# Patient Record
Sex: Male | Born: 1955 | Race: White | Hispanic: No | Smoking: Former smoker
Health system: Southern US, Community
[De-identification: ages and names within clinical notes are randomized; demographics above are authoritative.]

## PROBLEM LIST (undated history)

## (undated) DIAGNOSIS — I700 Atherosclerosis of aorta: Secondary | ICD-10-CM

## (undated) DIAGNOSIS — K219 Gastro-esophageal reflux disease without esophagitis: Secondary | ICD-10-CM

## (undated) DIAGNOSIS — E785 Hyperlipidemia, unspecified: Secondary | ICD-10-CM

## (undated) DIAGNOSIS — I10 Essential (primary) hypertension: Secondary | ICD-10-CM

## (undated) DIAGNOSIS — Z9981 Dependence on supplemental oxygen: Secondary | ICD-10-CM

## (undated) DIAGNOSIS — I5020 Unspecified systolic (congestive) heart failure: Secondary | ICD-10-CM

## (undated) DIAGNOSIS — I509 Heart failure, unspecified: Secondary | ICD-10-CM

## (undated) DIAGNOSIS — M1990 Unspecified osteoarthritis, unspecified site: Secondary | ICD-10-CM

## (undated) DIAGNOSIS — N401 Enlarged prostate with lower urinary tract symptoms: Secondary | ICD-10-CM

## (undated) DIAGNOSIS — J189 Pneumonia, unspecified organism: Secondary | ICD-10-CM

## (undated) DIAGNOSIS — R001 Bradycardia, unspecified: Secondary | ICD-10-CM

## (undated) DIAGNOSIS — R918 Other nonspecific abnormal finding of lung field: Secondary | ICD-10-CM

## (undated) DIAGNOSIS — N200 Calculus of kidney: Secondary | ICD-10-CM

## (undated) DIAGNOSIS — J449 Chronic obstructive pulmonary disease, unspecified: Secondary | ICD-10-CM

## (undated) DIAGNOSIS — M539 Dorsopathy, unspecified: Secondary | ICD-10-CM

## (undated) DIAGNOSIS — J439 Emphysema, unspecified: Secondary | ICD-10-CM

## (undated) DIAGNOSIS — N138 Other obstructive and reflux uropathy: Secondary | ICD-10-CM

## (undated) DIAGNOSIS — U071 COVID-19: Secondary | ICD-10-CM

## (undated) DIAGNOSIS — R0600 Dyspnea, unspecified: Secondary | ICD-10-CM

## (undated) HISTORY — PX: BACK SURGERY: SHX140

---

## 1989-04-03 HISTORY — PX: FOOT SURGERY: SHX648

## 2005-04-13 ENCOUNTER — Ambulatory Visit: Payer: Self-pay

## 2005-06-05 ENCOUNTER — Ambulatory Visit: Payer: Self-pay | Admitting: General Practice

## 2005-06-05 HISTORY — PX: KNEE ARTHROSCOPY W/ MENISCECTOMY: SHX1879

## 2006-05-01 ENCOUNTER — Ambulatory Visit: Payer: Self-pay | Admitting: Internal Medicine

## 2007-12-17 ENCOUNTER — Other Ambulatory Visit: Payer: Self-pay

## 2007-12-18 ENCOUNTER — Observation Stay: Payer: Self-pay | Admitting: Internal Medicine

## 2007-12-26 ENCOUNTER — Ambulatory Visit: Payer: Self-pay | Admitting: Internal Medicine

## 2009-06-01 ENCOUNTER — Ambulatory Visit: Payer: Self-pay | Admitting: Otolaryngology

## 2012-03-26 ENCOUNTER — Ambulatory Visit: Payer: Self-pay | Admitting: Orthopedic Surgery

## 2012-04-03 HISTORY — PX: ENDOSCOPIC RELEASE TRANSVERSE CARPAL LIGAMENT OF HAND: SUR444

## 2012-05-04 HISTORY — PX: OTHER SURGICAL HISTORY: SHX169

## 2013-11-04 DIAGNOSIS — J449 Chronic obstructive pulmonary disease, unspecified: Secondary | ICD-10-CM | POA: Insufficient documentation

## 2014-08-17 ENCOUNTER — Other Ambulatory Visit: Payer: Self-pay | Admitting: Infectious Diseases

## 2014-08-17 DIAGNOSIS — K219 Gastro-esophageal reflux disease without esophagitis: Secondary | ICD-10-CM | POA: Insufficient documentation

## 2014-08-17 DIAGNOSIS — N5089 Other specified disorders of the male genital organs: Secondary | ICD-10-CM

## 2014-08-21 ENCOUNTER — Ambulatory Visit: Payer: 59

## 2015-12-01 ENCOUNTER — Other Ambulatory Visit: Payer: Self-pay | Admitting: Specialist

## 2015-12-01 DIAGNOSIS — R918 Other nonspecific abnormal finding of lung field: Secondary | ICD-10-CM

## 2015-12-07 DIAGNOSIS — R0609 Other forms of dyspnea: Secondary | ICD-10-CM

## 2015-12-13 ENCOUNTER — Inpatient Hospital Stay
Admission: RE | Admit: 2015-12-13 | Discharge: 2015-12-13 | Disposition: A | Discharge status: Home / Self Care | Payer: Self-pay | Source: Ambulatory Visit | Attending: Specialist | Admitting: Specialist

## 2015-12-13 ENCOUNTER — Ambulatory Visit
Admission: RE | Admit: 2015-12-13 | Discharge: 2015-12-13 | Disposition: A | Discharge status: Home / Self Care | Payer: Managed Care, Other (non HMO) | Source: Ambulatory Visit | Attending: Specialist | Admitting: Specialist

## 2015-12-13 ENCOUNTER — Other Ambulatory Visit: Payer: Self-pay | Admitting: Specialist

## 2015-12-13 DIAGNOSIS — R918 Other nonspecific abnormal finding of lung field: Secondary | ICD-10-CM

## 2015-12-13 DIAGNOSIS — J432 Centrilobular emphysema: Secondary | ICD-10-CM | POA: Diagnosis not present

## 2015-12-13 DIAGNOSIS — I700 Atherosclerosis of aorta: Secondary | ICD-10-CM | POA: Insufficient documentation

## 2015-12-17 ENCOUNTER — Other Ambulatory Visit: Payer: Self-pay | Admitting: Specialist

## 2015-12-17 DIAGNOSIS — R918 Other nonspecific abnormal finding of lung field: Secondary | ICD-10-CM

## 2016-01-31 DIAGNOSIS — I493 Ventricular premature depolarization: Secondary | ICD-10-CM | POA: Insufficient documentation

## 2016-01-31 DIAGNOSIS — E782 Mixed hyperlipidemia: Secondary | ICD-10-CM | POA: Insufficient documentation

## 2016-01-31 DIAGNOSIS — I10 Essential (primary) hypertension: Secondary | ICD-10-CM | POA: Insufficient documentation

## 2016-03-21 DIAGNOSIS — J9611 Chronic respiratory failure with hypoxia: Secondary | ICD-10-CM | POA: Insufficient documentation

## 2016-03-21 DIAGNOSIS — J432 Centrilobular emphysema: Secondary | ICD-10-CM | POA: Insufficient documentation

## 2016-04-10 ENCOUNTER — Ambulatory Visit: Payer: Managed Care, Other (non HMO)

## 2016-04-24 ENCOUNTER — Ambulatory Visit
Admission: RE | Admit: 2016-04-24 | Discharge: 2016-04-24 | Disposition: A | Discharge status: Home / Self Care | Payer: Managed Care, Other (non HMO) | Source: Ambulatory Visit | Attending: Specialist | Admitting: Specialist

## 2016-04-24 DIAGNOSIS — R918 Other nonspecific abnormal finding of lung field: Secondary | ICD-10-CM | POA: Diagnosis present

## 2016-04-26 ENCOUNTER — Other Ambulatory Visit: Payer: Self-pay | Admitting: Specialist

## 2016-04-26 DIAGNOSIS — R911 Solitary pulmonary nodule: Secondary | ICD-10-CM

## 2016-05-08 DIAGNOSIS — M5432 Sciatica, left side: Secondary | ICD-10-CM | POA: Insufficient documentation

## 2016-07-20 DIAGNOSIS — R001 Bradycardia, unspecified: Secondary | ICD-10-CM | POA: Insufficient documentation

## 2016-07-20 DIAGNOSIS — I5022 Chronic systolic (congestive) heart failure: Secondary | ICD-10-CM | POA: Insufficient documentation

## 2016-07-25 ENCOUNTER — Ambulatory Visit
Admission: RE | Admit: 2016-07-25 | Discharge: 2016-07-25 | Disposition: A | Discharge status: Home / Self Care | Payer: Managed Care, Other (non HMO) | Source: Ambulatory Visit | Attending: Specialist | Admitting: Specialist

## 2016-07-25 DIAGNOSIS — R918 Other nonspecific abnormal finding of lung field: Secondary | ICD-10-CM | POA: Diagnosis not present

## 2016-07-25 DIAGNOSIS — J439 Emphysema, unspecified: Secondary | ICD-10-CM | POA: Insufficient documentation

## 2016-07-25 DIAGNOSIS — R911 Solitary pulmonary nodule: Secondary | ICD-10-CM

## 2016-07-31 DIAGNOSIS — Q762 Congenital spondylolisthesis: Secondary | ICD-10-CM | POA: Insufficient documentation

## 2016-07-31 DIAGNOSIS — M5136 Other intervertebral disc degeneration, lumbar region: Secondary | ICD-10-CM | POA: Insufficient documentation

## 2016-08-04 HISTORY — PX: OTHER SURGICAL HISTORY: SHX169

## 2016-12-12 ENCOUNTER — Encounter: Payer: Managed Care, Other (non HMO) | Attending: Specialist

## 2016-12-12 VITALS — Ht 67.9 in | Wt 157.7 lb

## 2016-12-12 DIAGNOSIS — J449 Chronic obstructive pulmonary disease, unspecified: Secondary | ICD-10-CM | POA: Diagnosis not present

## 2016-12-12 DIAGNOSIS — Z87891 Personal history of nicotine dependence: Secondary | ICD-10-CM | POA: Diagnosis not present

## 2016-12-12 NOTE — Patient Instructions (Signed)
Patient Instructions  Patient Details  Name: Bryan Montoya MRN: [id number] Date of Birth: November 18, 1955 Referring Provider:  Mertie Moores, MD  Below are the personal goals you chose as well as exercise and nutrition goals. Our goal is to help you keep on track towards obtaining and maintaining your goals. We will be discussing your progress on these goals with you throughout the program.  Initial Exercise Prescription:     Initial Exercise Prescription - 12/12/16 1600      Date of Initial Exercise RX and Referring Provider   Date 12/12/16   Referring Provider Brett Canales MD     Oxygen   Oxygen Continuous   Liters 2     Treadmill   MPH 2.6   Grade 0.5   Minutes 15   METs 3.17     NuStep   Level 3   SPM 80   Minutes 15   METs 3     REL-XR   Level 2   Speed 50   Minutes 15   METs 3     Prescription Details   Frequency (times per week) 3   Duration Progress to 45 minutes of aerobic exercise without signs/symptoms of physical distress     Intensity   THRR 40-80% of Max Heartrate 93-136   Ratings of Perceived Exertion 11-13   Perceived Dyspnea 0-4     Progression   Progression Continue to progress workloads to maintain intensity without signs/symptoms of physical distress.     Resistance Training   Training Prescription Yes   Weight 3 lbs   Reps 10-15      Exercise Goals: Frequency: Be able to perform aerobic exercise three times per week working toward 3-5 days per week.  Intensity: Work with a perceived exertion of 11 (fairly light) - 15 (hard) as tolerated. Follow your new exercise prescription and watch for changes in prescription as you progress with the program. Changes will be reviewed with you when they are made.  Duration: You should be able to do 30 minutes of continuous aerobic exercise in addition to a 5 minute warm-up and a 5 minute cool-down routine.  Nutrition Goals: Your personal nutrition goals will be established when you do your  nutrition analysis with the dietician.  The following are nutrition guidelines to follow: Cholesterol < /day Sodium < /day Fiber: Men over 50 yrs - 30 grams per day  Personal Goals:     Personal Goals and Risk Factors at Admission - 12/12/16 1414      Core Components/Risk Factors/Patient Goals on Admission    Weight Management Weight Maintenance;Yes   Intervention Weight Management: Develop a combined nutrition and exercise program designed to reach desired caloric intake, while maintaining appropriate intake of nutrient and fiber, sodium and fats, and appropriate energy expenditure required for the weight goal.;Weight Management: Provide education and appropriate resources to help participant work on and attain dietary goals.   Admit Weight 157 lb 11.2 oz (71.5 kg)   Goal Weight: Short Term 157 lb (71.2 kg)   Goal Weight: Long Term 155 lb (70.3 kg)   Expected Outcomes Short Term: Continue to assess and modify interventions until short term weight is achieved;Long Term: Adherence to nutrition and physical activity/exercise program aimed toward attainment of established weight goal;Weight Maintenance: Understanding of the daily nutrition guidelines, which includes 25-35% calories from fat, 7% or less cal from saturated fats, less than  cholesterol, less than 1.5gm of sodium, & 5 or more servings of fruits and  vegetables daily   Improve shortness of breath with ADL's Yes   Intervention Provide education, individualized exercise plan and daily activity instruction to help decrease symptoms of SOB with activities of daily living.   Expected Outcomes Short Term: Achieves a reduction of symptoms when performing activities of daily living.   Stress Yes   Intervention Offer individual and/or small group education and counseling on adjustment to heart disease, stress management and health-related lifestyle change. Teach and support self-help strategies.;Refer participants experiencing  significant psychosocial distress to appropriate mental health specialists for further evaluation and treatment. When possible, include family members and significant others in education/counseling sessions.   Expected Outcomes Short Term: Participant demonstrates changes in health-related behavior, relaxation and other stress management skills, ability to obtain effective social support, and compliance with psychotropic medications if prescribed.;Long Term: Emotional wellbeing is indicated by absence of clinically significant psychosocial distress or social isolation.      Tobacco Use Initial Evaluation: History  Smoking Status  . Former Smoker  . Packs/day: 1.00  . Years: 30.00  . Types: Cigarettes  . Quit date: 01/01/2006  Smokeless Tobacco  . Never Used    Comment: patient does not smoke    Exercise Goals and Review:     Exercise Goals    Row Name 12/12/16 1613             Exercise Goals   Increase Physical Activity Yes       Intervention Provide advice, education, support and counseling about physical activity/exercise needs.;Develop an individualized exercise prescription for aerobic and resistive training based on initial evaluation findings, risk stratification, comorbidities and participant's personal goals.       Expected Outcomes Achievement of increased cardiorespiratory fitness and enhanced flexibility, muscular endurance and strength shown through measurements of functional capacity and personal statement of participant.       Increase Strength and Stamina Yes       Intervention Provide advice, education, support and counseling about physical activity/exercise needs.;Develop an individualized exercise prescription for aerobic and resistive training based on initial evaluation findings, risk stratification, comorbidities and participant's personal goals.       Expected Outcomes Achievement of increased cardiorespiratory fitness and enhanced flexibility, muscular  endurance and strength shown through measurements of functional capacity and personal statement of participant.       Able to understand and use rate of perceived exertion (RPE) scale Yes       Intervention Provide education and explanation on how to use RPE scale       Expected Outcomes Short Term: Able to use RPE daily in rehab to express subjective intensity level;Long Term:  Able to use RPE to guide intensity level when exercising independently       Able to understand and use Dyspnea scale Yes       Intervention Provide education and explanation on how to use Dyspnea scale       Expected Outcomes Short Term: Able to use Dyspnea scale daily in rehab to express subjective sense of shortness of breath during exertion;Long Term: Able to use Dyspnea scale to guide intensity level when exercising independently       Knowledge and understanding of Target Heart Rate Range (THRR) Yes       Intervention Provide education and explanation of THRR including how the numbers were predicted and where they are located for reference       Expected Outcomes Short Term: Able to state/look up THRR;Long Term: Able to use THRR to govern  intensity when exercising independently;Short Term: Able to use daily as guideline for intensity in rehab       Able to check pulse independently Yes       Intervention Provide education and demonstration on how to check pulse in carotid and radial arteries.;Review the importance of being able to check your own pulse for safety during independent exercise       Expected Outcomes Short Term: Able to explain why pulse checking is important during independent exercise;Long Term: Able to check pulse independently and accurately       Understanding of Exercise Prescription Yes       Intervention Provide education, explanation, and written materials on patient's individual exercise prescription       Expected Outcomes Short Term: Able to explain program exercise prescription;Long Term: Able  to explain home exercise prescription to exercise independently          Copy of goals given to participant.

## 2016-12-12 NOTE — Progress Notes (Signed)
Pulmonary Individual Treatment Plan  Patient Details  Name: Bryan Montoya MRN: [id number] Date of Birth: May 19, 1955 Referring Provider:     Pulmonary Rehab from 12/12/2016 in [facility] Cardiac and Pulmonary Rehab  Referring Provider  Brett Canales MD      Initial Encounter Date:    Pulmonary Rehab from 12/12/2016 in [facility] Cardiac and Pulmonary Rehab  Date  12/12/16  Referring Provider  Brett Canales MD      Visit Diagnosis: Chronic obstructive pulmonary disease, unspecified COPD type (HCC)  Patient's Home Medications on Admission: No current outpatient prescriptions on file.  Past Medical History: History reviewed. No pertinent past medical history.  Tobacco Use: History  Smoking Status  . Former Smoker  . Packs/day: 1.00  . Years: 30.00  . Types: Cigarettes  . Quit date: 01/01/2006  Smokeless Tobacco  . Never Used    Comment: patient does not smoke    Labs: Recent Review Flowsheet Data    There is no flowsheet data to display.       Pulmonary Assessment Scores:     Pulmonary Assessment Scores    Row Name 12/12/16 1459         ADL UCSD   ADL Phase Entry     SOB Score total 44     Rest 0     Walk 1     Stairs 5     Bath 2     Dress 1     Shop 1       CAT Score   CAT Score 22       mMRC Score   mMRC Score 2        Pulmonary Function Assessment:     Pulmonary Function Assessment - 12/12/16 1513      Initial Spirometry Results   FVC% 71 %   FEV1% 49 %   FEV1/FVC Ratio 52.34     Post Bronchodilator Spirometry Results   FVC% 66.03 %   FEV1% 47.04 %   FEV1/FVC Ratio 54     Breath   Bilateral Breath Sounds Clear;Decreased   Shortness of Breath Limiting activity;Yes      Exercise Target Goals: Date: 12/12/16  Exercise Program Goal: Individual exercise prescription set with THRR, safety & activity barriers. Participant demonstrates ability to understand and report RPE using BORG scale, to self-measure pulse accurately, and to  acknowledge the importance of the exercise prescription.  Exercise Prescription Goal: Starting with aerobic activity 30 plus minutes a day, 3 days per week for initial exercise prescription. Provide home exercise prescription and guidelines that participant acknowledges understanding prior to discharge.  Activity Barriers & Risk Stratification:     Activity Barriers & Cardiac Risk Stratification - 12/12/16 1612      Activity Barriers & Cardiac Risk Stratification   Activity Barriers Back Problems;Neck/Spine Problems;Shortness of Breath;Muscular Weakness;Deconditioning  back surgery 4 months ago (fusion L4-S1)      6 Minute Walk:     6 Minute Walk    Row Name 12/12/16 1610         6 Minute Walk   Phase Initial     Distance 1420 feet  wearing back brace     Walk Time 6 minutes     # of Rest Breaks 0     MPH 2.69     METS 3.34     RPE 13     Perceived Dyspnea  2     VO2 Peak 11.7     Symptoms No  Resting HR 51 bpm     Resting BP 128/64     Resting Oxygen Saturation  98 %     Exercise Oxygen Saturation  during 6 min walk 84 %     Max Ex. HR 61 bpm     Max Ex. BP 134/74     2 Minute Post BP 122/70       Interval HR   1 Minute HR 58     2 Minute HR 61     4 Minute HR 58     5 Minute HR 58     6 Minute HR 61     2 Minute Post HR 53     Interval Heart Rate? Yes       Interval Oxygen   Interval Oxygen? Yes     Baseline Oxygen Saturation % 98 %     1 Minute Oxygen Saturation % 96 %     1 Minute Liters of Oxygen 2 L  pulsed     2 Minute Oxygen Saturation % 94 %     2 Minute Liters of Oxygen 2 L     3 Minute Oxygen Saturation % 89 %     3 Minute Liters of Oxygen 2 L     4 Minute Oxygen Saturation % 91 %     4 Minute Liters of Oxygen 2 L     5 Minute Oxygen Saturation % 92 %     5 Minute Liters of Oxygen 2 L     6 Minute Oxygen Saturation % 84 %     6 Minute Liters of Oxygen 2 L     2 Minute Post Oxygen Saturation % 100 %     2 Minute Post Liters of Oxygen  2 L       Oxygen Initial Assessment:     Oxygen Initial Assessment - 12/12/16 1520      Home Oxygen   Home Oxygen Device Portable Concentrator;Home Concentrator;E-Tanks   Sleep Oxygen Prescription Continuous   Liters per minute 2   Home Exercise Oxygen Prescription Continuous   Liters per minute 2   Home at Rest Exercise Oxygen Prescription None   Compliance with Home Oxygen Use Yes     Initial 6 min Walk   Oxygen Used Pulsed   Liters per minute 2     Program Oxygen Prescription   Program Oxygen Prescription Continuous   Liters per minute 2     Intervention   Short Term Goals To learn and exhibit compliance with exercise, home and travel O2 prescription;To learn and understand importance of maintaining oxygen saturations>88%;To learn and demonstrate proper use of respiratory medications;To learn and demonstrate proper pursed lip breathing techniques or other breathing techniques.;To learn and understand importance of monitoring SPO2 with pulse oximeter and demonstrate accurate use of the pulse oximeter.   Long  Term Goals Exhibits compliance with exercise, home and travel O2 prescription;Maintenance of O2 saturations>88%;Compliance with respiratory medication;Demonstrates proper use of MDI's;Exhibits proper breathing techniques, such as pursed lip breathing or other method taught during program session;Verbalizes importance of monitoring SPO2 with pulse oximeter and return demonstration      Oxygen Re-Evaluation:   Oxygen Discharge (Final Oxygen Re-Evaluation):   Initial Exercise Prescription:     Initial Exercise Prescription - 12/12/16 1600      Date of Initial Exercise RX and Referring Provider   Date 12/12/16   Referring Provider Brett Canales MD     Oxygen   Oxygen Continuous  Liters 2     Treadmill   MPH 2.6   Grade 0.5   Minutes 15   METs 3.17     NuStep   Level 3   SPM 80   Minutes 15   METs 3     REL-XR   Level 2   Speed 50   Minutes 15    METs 3     Prescription Details   Frequency (times per week) 3   Duration Progress to 45 minutes of aerobic exercise without signs/symptoms of physical distress     Intensity   THRR 40-80% of Max Heartrate 93-136   Ratings of Perceived Exertion 11-13   Perceived Dyspnea 0-4     Progression   Progression Continue to progress workloads to maintain intensity without signs/symptoms of physical distress.     Resistance Training   Training Prescription Yes   Weight 3 lbs   Reps 10-15      Perform Capillary Blood Glucose checks as needed.  Exercise Prescription Changes:     Exercise Prescription Changes    Row Name 12/12/16 1600             Response to Exercise   Blood Pressure (Admit) 128/64       Blood Pressure (Exercise) 134/74       Blood Pressure (Exit) 122/70       Heart Rate (Admit) 51 bpm       Heart Rate (Exercise) 61 bpm       Heart Rate (Exit) 53 bpm       Oxygen Saturation (Admit) 98 %       Oxygen Saturation (Exercise) 84 %       Oxygen Saturation (Exit) 100 %       Rating of Perceived Exertion (Exercise) 13       Perceived Dyspnea (Exercise) 2       Symptoms none       Comments walk test results, wearing back brace          Exercise Comments:   Exercise Goals and Review:     Exercise Goals    Row Name 12/12/16 1613             Exercise Goals   Increase Physical Activity Yes       Intervention Provide advice, education, support and counseling about physical activity/exercise needs.;Develop an individualized exercise prescription for aerobic and resistive training based on initial evaluation findings, risk stratification, comorbidities and participant's personal goals.       Expected Outcomes Achievement of increased cardiorespiratory fitness and enhanced flexibility, muscular endurance and strength shown through measurements of functional capacity and personal statement of participant.       Increase Strength and Stamina Yes        Intervention Provide advice, education, support and counseling about physical activity/exercise needs.;Develop an individualized exercise prescription for aerobic and resistive training based on initial evaluation findings, risk stratification, comorbidities and participant's personal goals.       Expected Outcomes Achievement of increased cardiorespiratory fitness and enhanced flexibility, muscular endurance and strength shown through measurements of functional capacity and personal statement of participant.       Able to understand and use rate of perceived exertion (RPE) scale Yes       Intervention Provide education and explanation on how to use RPE scale       Expected Outcomes Short Term: Able to use RPE daily in rehab to express subjective intensity level;Long Term:  Able to  use RPE to guide intensity level when exercising independently       Able to understand and use Dyspnea scale Yes       Intervention Provide education and explanation on how to use Dyspnea scale       Expected Outcomes Short Term: Able to use Dyspnea scale daily in rehab to express subjective sense of shortness of breath during exertion;Long Term: Able to use Dyspnea scale to guide intensity level when exercising independently       Knowledge and understanding of Target Heart Rate Range (THRR) Yes       Intervention Provide education and explanation of THRR including how the numbers were predicted and where they are located for reference       Expected Outcomes Short Term: Able to state/look up THRR;Long Term: Able to use THRR to govern intensity when exercising independently;Short Term: Able to use daily as guideline for intensity in rehab       Able to check pulse independently Yes       Intervention Provide education and demonstration on how to check pulse in carotid and radial arteries.;Review the importance of being able to check your own pulse for safety during independent exercise       Expected Outcomes Short Term:  Able to explain why pulse checking is important during independent exercise;Long Term: Able to check pulse independently and accurately       Understanding of Exercise Prescription Yes       Intervention Provide education, explanation, and written materials on patient's individual exercise prescription       Expected Outcomes Short Term: Able to explain program exercise prescription;Long Term: Able to explain home exercise prescription to exercise independently          Exercise Goals Re-Evaluation :   Discharge Exercise Prescription (Final Exercise Prescription Changes):     Exercise Prescription Changes - 12/12/16 1600      Response to Exercise   Blood Pressure (Admit) 128/64   Blood Pressure (Exercise) 134/74   Blood Pressure (Exit) 122/70   Heart Rate (Admit) 51 bpm   Heart Rate (Exercise) 61 bpm   Heart Rate (Exit) 53 bpm   Oxygen Saturation (Admit) 98 %   Oxygen Saturation (Exercise) 84 %   Oxygen Saturation (Exit) 100 %   Rating of Perceived Exertion (Exercise) 13   Perceived Dyspnea (Exercise) 2   Symptoms none   Comments walk test results, wearing back brace      Nutrition:  Target Goals: Understanding of nutrition guidelines, daily intake of sodium 1500mg , cholesterol 200mg , calories 30% from fat and 7% or less from saturated fats, daily to have 5 or more servings of fruits and vegetables.  Biometrics:     Pre Biometrics - 12/12/16 1616      Pre Biometrics   Height 5' 7.9" (1.725 m)   Weight 157 lb 11.2 oz (71.5 kg)   Waist Circumference 38 inches   Hip Circumference 38.25 inches   Waist to Hip Ratio 0.99 %   BMI (Calculated) 24.04       Nutrition Therapy Plan and Nutrition Goals:     Nutrition Therapy & Goals - 12/12/16 1519      Nutrition Therapy   RD appointment defered Yes     Intervention Plan   Intervention Nutrition handout(s) given to patient.;Prescribe, educate and counsel regarding individualized specific dietary modifications aiming  towards targeted core components such as weight, hypertension, lipid management, diabetes, heart failure and other comorbidities.   Expected  Outcomes Short Term Goal: Understand basic principles of dietary content, such as calories, fat, sodium, cholesterol and nutrients.;Long Term Goal: Adherence to prescribed nutrition plan.      Nutrition Discharge: Rate Your Plate Scores:     Nutrition Assessments - 12/12/16 1546      MEDFICTS Scores   Pre Score 70      Nutrition Goals Re-Evaluation:   Nutrition Goals Discharge (Final Nutrition Goals Re-Evaluation):   Psychosocial: Target Goals: Acknowledge presence or absence of significant depression and/or stress, maximize coping skills, provide positive support system. Participant is able to verbalize types and ability to use techniques and skills needed for reducing stress and depression.   Initial Review & Psychosocial Screening:     Initial Psych Review & Screening - 12/12/16 1505      Initial Review   Current issues with Current Depression;Current Sleep Concerns;Current Stress Concerns   Source of Stress Concerns Chronic Illness;Unable to perform yard/household activities     Family Dynamics   Good Support System? Yes     Barriers   Psychosocial barriers to participate in program The patient should benefit from training in stress management and relaxation.     Screening Interventions   Interventions Yes;Encouraged to exercise;To provide support and resources with identified psychosocial needs   Expected Outcomes Short Term goal: Utilizing psychosocial counselor, staff and physician to assist with identification of specific Stressors or current issues interfering with healing process. Setting desired goal for each stressor or current issue identified.;Long Term Goal: Stressors or current issues are controlled or eliminated.;Short Term goal: Identification and review with participant of any Quality of Life or Depression concerns  found by scoring the questionnaire.;Long Term goal: The participant improves quality of Life and PHQ9 Scores as seen by post scores and/or verbalization of changes      Quality of Life Scores:   PHQ-9: Recent Review Flowsheet Data    Depression screen [facility] 2/9 12/12/2016   Decreased Interest 2   Down, Depressed, Hopeless 2   PHQ - 2 Score 4   Altered sleeping 2   Tired, decreased energy 2   Change in appetite 0   Feeling bad or failure about yourself  2   Trouble concentrating 3   Moving slowly or fidgety/restless 3   Suicidal thoughts 0   PHQ-9 Score 16   Difficult doing work/chores Somewhat difficult     Interpretation of Total Score  Total Score Depression Severity:  1-4 = Minimal depression, 5-9 = Mild depression, 10-14 = Moderate depression, 15-19 = Moderately severe depression, 20-27 = Severe depression   Psychosocial Evaluation and Intervention:   Psychosocial Re-Evaluation:   Psychosocial Discharge (Final Psychosocial Re-Evaluation):   Education: Education Goals: Education classes will be provided on a weekly basis, covering required topics. Participant will state understanding/return demonstration of topics presented.  Learning Barriers/Preferences:     Learning Barriers/Preferences - 12/12/16 1518      Learning Barriers/Preferences   Learning Barriers Sight   Learning Preferences Skilled Demonstration      Education Topics: Initial Evaluation Education: - Verbal, written and demonstration of respiratory meds, RPE/PD scales, oximetry and breathing techniques. Instruction on use of nebulizers and MDIs: cleaning and proper use, rinsing mouth with steroid doses and importance of monitoring MDI activations.   Pulmonary Rehab from 12/12/2016 in [facility] Cardiac and Pulmonary Rehab  Date  12/12/16  Educator  [facility]  Instruction Review Code  1- Verbalizes Understanding      General Nutrition Guidelines/Fats and Fiber: -Group instruction provided by  verbal, written  material, models and posters to present the general guidelines for heart healthy nutrition. Gives an explanation and review of dietary fats and fiber.   Controlling Sodium/Reading Food Labels: -Group verbal and written material supporting the discussion of sodium use in heart healthy nutrition. Review and explanation with models, verbal and written materials for utilization of the food label.   Exercise Physiology & Risk Factors: - Group verbal and written instruction with models to review the exercise physiology of the cardiovascular system and associated critical values. Details cardiovascular disease risk factors and the goals associated with each risk factor.   Aerobic Exercise & Resistance Training: - Gives group verbal and written discussion on the health impact of inactivity. On the components of aerobic and resistive training programs and the benefits of this training and how to safely progress through these programs.   Flexibility, Balance, General Exercise Guidelines: - Provides group verbal and written instruction on the benefits of flexibility and balance training programs. Provides general exercise guidelines with specific guidelines to those with heart or lung disease. Demonstration and skill practice provided.   Stress Management: - Provides group verbal and written instruction about the health risks of elevated stress, cause of high stress, and healthy ways to reduce stress.   Depression: - Provides group verbal and written instruction on the correlation between heart/lung disease and depressed mood, treatment options, and the stigmas associated with seeking treatment.   Exercise & Equipment Safety: - Individual verbal instruction and demonstration of equipment use and safety with use of the equipment.   Pulmonary Rehab from 12/12/2016 in [facility] Cardiac and Pulmonary Rehab  Date  12/12/16  Educator  [facility]  Instruction Review Code  1- Verbalizes Understanding       Infection Prevention: - Provides verbal and written material to individual with discussion of infection control including proper hand washing and proper equipment cleaning during exercise session.   Pulmonary Rehab from 12/12/2016 in [facility] Cardiac and Pulmonary Rehab  Date  12/12/16  Educator  [facility]  Instruction Review Code  1- Verbalizes Understanding      Falls Prevention: - Provides verbal and written material to individual with discussion of falls prevention and safety.   Pulmonary Rehab from 12/12/2016 in [facility] Cardiac and Pulmonary Rehab  Date  12/12/16  Educator  [facility]  Instruction Review Code  1- Verbalizes Understanding      Diabetes: - Individual verbal and written instruction to review signs/symptoms of diabetes, desired ranges of glucose level fasting, after meals and with exercise. Advice that pre and post exercise glucose checks will be done for 3 sessions at entry of program.   Chronic Lung Diseases: - Group verbal and written instruction to review new updates, new respiratory medications, new advancements in procedures and treatments. Provide informative websites and "800" numbers of self-education.   Lung Procedures: - Group verbal and written instruction to describe testing methods done to diagnose lung disease. Review the outcome of test results. Describe the treatment choices: Pulmonary Function Tests, ABGs and oximetry.   Energy Conservation: - Provide group verbal and written instruction for methods to conserve energy, plan and organize activities. Instruct on pacing techniques, use of adaptive equipment and posture/positioning to relieve shortness of breath.   Triggers: - Group verbal and written instruction to review types of environmental controls: home humidity, furnaces, filters, dust mite/pet prevention, HEPA vacuums. To discuss weather changes, air quality and the benefits of nasal washing.   Exacerbations: - Group verbal and written instruction to  provide:  warning signs, infection symptoms, calling MD promptly, preventive modes, and value of vaccinations. Review: effective airway clearance, coughing and/or vibration techniques. Create an Sport and exercise psychologistAction Plan.   Oxygen: - Individual and group verbal and written instruction on oxygen therapy. Includes supplement oxygen, available portable oxygen systems, continuous and intermittent flow rates, oxygen safety, concentrators, and Medicare reimbursement for oxygen.   [facility]  Date  12/12/16  [facility]  Instruction Review Code  1- Verbalizes Understanding      Respiratory Medications: - Group verbal and written instruction to review medications for lung disease. Drug class, frequency, complications, importance of spacers, rinsing mouth after steroid MDI's, and proper cleaning methods for nebulizers.   [facility]  Date  12/12/16  [facility]  Instruction Review Code  1- Verbalizes Understanding      AED/CPR: - Group verbal and written instruction with the use of models to demonstrate the basic use of the AED with the basic ABC's of resuscitation.   Breathing Retraining: - Provides individuals verbal and written instruction on purpose, frequency, and proper technique of diaphragmatic breathing and pursed-lipped breathing. Applies individual practice skills.   [facility]  Date  12/12/16  [facility]  Instruction Review Code  1- Verbalizes Understanding      Anatomy and Physiology of the Lungs: - Group verbal and written instruction with the use of models to provide basic lung anatomy and physiology related to function, structure and complications of lung disease.   Anatomy & Physiology of the Heart: - Group verbal and written instruction and models provide basic cardiac anatomy and physiology, with the coronary  electrical and arterial systems. Review of: AMI, Angina, Valve disease, Heart Failure, Cardiac Arrhythmia, Pacemakers, and the ICD.   Heart Failure: - Group verbal and written instruction on the basics of heart failure: signs/symptoms, treatments, explanation of ejection fraction, enlarged heart and cardiomyopathy.   Sleep Apnea: - Individual verbal and written instruction to review Obstructive Sleep Apnea. Review of risk factors, methods for diagnosing and types of masks and machines for OSA.   Anxiety: - Provides group, verbal and written instruction on the correlation between heart/lung disease and anxiety, treatment options, and management of anxiety.   Relaxation: - Provides group, verbal and written instruction about the benefits of relaxation for patients with heart/lung disease. Also provides patients with examples of relaxation techniques.   Cardiac Medications: - Group verbal and written instruction to review commonly prescribed medications for heart disease. Reviews the medication, class of the drug, and side effects.   Know Your Numbers: -Group verbal and written instruction about important numbers in your health.  Review of Cholesterol, Blood Pressure, Diabetes, and BMI and the role they play in your overall health.   Other: -Provides group and verbal instruction on various topics (see comments)    Knowledge Questionnaire Score:     Knowledge Questionnaire Score - 12/12/16 1453      Knowledge Questionnaire Score   Pre Score 8/10  Reviewed with patient       Core Components/Risk Factors/Patient Goals at Admission:     Personal Goals and Risk Factors at Admission - 12/12/16 1414      Core Components/Risk Factors/Patient Goals on Admission    Weight Management Weight Maintenance;Yes   Intervention Weight Management: Develop a combined nutrition and exercise program designed to reach desired caloric intake, while maintaining  appropriate intake of nutrient  and fiber, sodium and fats, and appropriate energy expenditure required for the weight goal.;Weight Management: Provide education and appropriate resources to help participant work on and attain dietary goals.   Admit Weight 157 lb 11.2 oz (71.5 kg)   Goal Weight: Short Term 157 lb (71.2 kg)   Goal Weight: Long Term 155 lb (70.3 kg)   Expected Outcomes Short Term: Continue to assess and modify interventions until short term weight is achieved;Long Term: Adherence to nutrition and physical activity/exercise program aimed toward attainment of established weight goal;Weight Maintenance: Understanding of the daily nutrition guidelines, which includes 25-35% calories from fat, 7% or less cal from saturated fats, less than  cholesterol, less than 1.5gm of sodium, & 5 or more servings of fruits and vegetables daily   Improve shortness of breath with ADL's Yes   Intervention Provide education, individualized exercise plan and daily activity instruction to help decrease symptoms of SOB with activities of daily living.   Expected Outcomes Short Term: Achieves a reduction of symptoms when performing activities of daily living.   Stress Yes   Intervention Offer individual and/or small group education and counseling on adjustment to heart disease, stress management and health-related lifestyle change. Teach and support self-help strategies.;Refer participants experiencing significant psychosocial distress to appropriate mental health specialists for further evaluation and treatment. When possible, include family members and significant others in education/counseling sessions.   Expected Outcomes Short Term: Participant demonstrates changes in health-related behavior, relaxation and other stress management skills, ability to obtain effective social support, and compliance with psychotropic medications if prescribed.;Long Term: Emotional wellbeing is indicated by absence of clinically significant psychosocial  distress or social isolation.      Core Components/Risk Factors/Patient Goals Review:    Core Components/Risk Factors/Patient Goals at Discharge (Final Review):    ITP Comments:     ITP Comments    Row Name 12/12/16 1411           ITP Comments Medical Evaluation completed. Chart sent for review and changes to Dr. Bethann Punches director of [facility].          Comments: Initial ITP

## 2016-12-20 DIAGNOSIS — J449 Chronic obstructive pulmonary disease, unspecified: Secondary | ICD-10-CM | POA: Diagnosis not present

## 2016-12-20 NOTE — Progress Notes (Signed)
Daily Session Note  Patient Details  Name: CAIDAN HUBBERT MRN: [id number] Date of Birth: [birth year] Referring Provider:     Pulmonary Rehab from 12/12/2016 in [facility] Cardiac and Pulmonary Rehab  Referring Provider  Ancil Linsey MD      Encounter Date: 12/20/2016  Check In:     Session Check In - 12/20/16 1031      Check-In   Location [facility]-Cardiac & Pulmonary Rehab   Staff Present Alberteen Sam, MA, ACSM RCEP, Exercise Physiologist;Amanda Oletta Darter, BA, ACSM CEP, Exercise Physiologist;Joseph Flavia Shipper   Supervising physician immediately available to respond to emergencies [facility] immediately available ER MD   Physician(s) Williams/[place]   Medication changes reported     No   Fall or balance concerns reported    No   Warm-up and Cool-down Performed as group-led instruction   Resistance Training Performed Yes   VAD Patient? No     Pain Assessment   Currently in Pain? No/denies         History  Smoking Status  . Former Smoker  . Packs/day: 1.00  . Years: 30.00  . Types: Cigarettes  . Quit date: 01/01/2006  Smokeless Tobacco  . Never Used    Comment: patient does not smoke    Goals Met:  Proper associated with RPD/PD & O2 Sat Independence with exercise equipment Exercise tolerated well Strength training completed today  Goals Unmet:  Not Applicable  Comments: First full day of exercise!  Patient was oriented to gym and equipment including functions, settings, policies, and procedures.  Patient's individual exercise prescription and treatment plan were reviewed.  All starting workloads were established based on the results of the 6 minute walk test done at initial orientation visit.  The plan for exercise progression was also introduced and progression will be customized based on patient's performance and goals.    Dr. Emily Filbert is Medical Director for [facility] and [facility].

## 2016-12-22 DIAGNOSIS — J449 Chronic obstructive pulmonary disease, unspecified: Secondary | ICD-10-CM

## 2016-12-22 NOTE — Progress Notes (Signed)
Daily Session Note  Patient Details  Name: Bryan Montoya MRN: [id number] Date of Birth: 1955/11/01 Referring Provider:     Pulmonary Rehab from 12/12/2016 in [facility] Cardiac and Pulmonary Rehab  Referring Provider  Ancil Linsey MD      Encounter Date: 12/22/2016  Check In:     Session Check In - 12/22/16 1011      Check-In   Location [facility]-Cardiac & Pulmonary Rehab   Staff Present Nada Maclachlan, BA, ACSM CEP, Exercise Physiologist;Carroll Enterkin, RN, BSN;Sani Madariaga [employer] physician immediately available to respond to emergencies [facility] immediately available ER MD   Physician(s) Dr. Archie Balboa and Joni Fears   Medication changes reported     No   Fall or balance concerns reported    No   Warm-up and Cool-down Performed as group-led instruction   Resistance Training Performed Yes   VAD Patient? No     Pain Assessment   Currently in Pain? No/denies   Multiple Pain Sites No         History  Smoking Status  . Former Smoker  . Packs/day: 1.00  . Years: 30.00  . Types: Cigarettes  . Quit date: 01/01/2006  Smokeless Tobacco  . Never Used    Comment: patient does not smoke    Goals Met:  Exercise tolerated well No report of cardiac concerns or symptoms Strength training completed today  Goals Unmet:  Not Applicable  Comments: Pt able to follow exercise prescription today without complaint.  Will continue to monitor for progression.   Dr. Emily Filbert is Medical Director for [facility] and [facility].

## 2016-12-25 DIAGNOSIS — J449 Chronic obstructive pulmonary disease, unspecified: Secondary | ICD-10-CM

## 2016-12-25 NOTE — Progress Notes (Signed)
Pulmonary Individual Treatment Plan  Patient Details  Name: Bryan Montoya MRN: [id number] Date of Birth: [birth year] Referring Provider:     Pulmonary Rehab from 12/12/2016 in [facility] Cardiac and Pulmonary Rehab  Referring Provider  Brett Canales MD      Initial Encounter Date:    Pulmonary Rehab from 12/12/2016 in [facility] Cardiac and Pulmonary Rehab  Date  12/12/16  Referring Provider  Brett Canales MD      Visit Diagnosis: Chronic obstructive pulmonary disease, unspecified COPD type (HCC)  Patient's Home Medications on Admission: No current outpatient prescriptions on file.  Past Medical History: No past medical history on file.  Tobacco Use: History  Smoking Status  . Former Smoker  . Packs/day: 1.00  . Years: 30.00  . Types: Cigarettes  . Quit date: 01/01/2006  Smokeless Tobacco  . Never Used    Comment: patient does not smoke    Labs: Recent Review Flowsheet Data    There is no flowsheet data to display.       Pulmonary Assessment Scores:     Pulmonary Assessment Scores    Row Name 12/12/16 1459         ADL UCSD   ADL Phase Entry     SOB Score total 44     Rest 0     Walk 1     Stairs 5     Bath 2     Dress 1     Shop 1       CAT Score   CAT Score 22       mMRC Score   mMRC Score 2        Pulmonary Function Assessment:     Pulmonary Function Assessment - 12/12/16 1513      Initial Spirometry Results   FVC% 71 %   FEV1% 49 %   FEV1/FVC Ratio 52.34     Post Bronchodilator Spirometry Results   FVC% 66.03 %   FEV1% 47.04 %   FEV1/FVC Ratio 54     Breath   Bilateral Breath Sounds Clear;Decreased   Shortness of Breath Limiting activity;Yes      Exercise Target Goals:    Exercise Program Goal: Individual exercise prescription set with THRR, safety & activity barriers. Participant demonstrates ability to understand and report RPE using BORG scale, to self-measure pulse accurately, and to acknowledge the importance of the exercise  prescription.  Exercise Prescription Goal: Starting with aerobic activity 30 plus minutes a day, 3 days per week for initial exercise prescription. Provide home exercise prescription and guidelines that participant acknowledges understanding prior to discharge.  Activity Barriers & Risk Stratification:     Activity Barriers & Cardiac Risk Stratification - 12/12/16 1612      Activity Barriers & Cardiac Risk Stratification   Activity Barriers Back Problems;Neck/Spine Problems;Shortness of Breath;Muscular Weakness;Deconditioning  back surgery 4 months ago (fusion L4-S1)      6 Minute Walk:     6 Minute Walk    Row Name 12/12/16 1610         6 Minute Walk   Phase Initial     Distance 1420 feet  wearing back brace     Walk Time 6 minutes     # of Rest Breaks 0     MPH 2.69     METS 3.34     RPE 13     Perceived Dyspnea  2     VO2 Peak 11.7     Symptoms No  Resting HR 51 bpm     Resting BP 128/64     Resting Oxygen Saturation  98 %     Exercise Oxygen Saturation  during 6 min walk 84 %     Max Ex. HR 61 bpm     Max Ex. BP 134/74     2 Minute Post BP 122/70       Interval HR   1 Minute HR 58     2 Minute HR 61     4 Minute HR 58     5 Minute HR 58     6 Minute HR 61     2 Minute Post HR 53     Interval Heart Rate? Yes       Interval Oxygen   Interval Oxygen? Yes     Baseline Oxygen Saturation % 98 %     1 Minute Oxygen Saturation % 96 %     1 Minute Liters of Oxygen 2 L  pulsed     2 Minute Oxygen Saturation % 94 %     2 Minute Liters of Oxygen 2 L     3 Minute Oxygen Saturation % 89 %     3 Minute Liters of Oxygen 2 L     4 Minute Oxygen Saturation % 91 %     4 Minute Liters of Oxygen 2 L     5 Minute Oxygen Saturation % 92 %     5 Minute Liters of Oxygen 2 L     6 Minute Oxygen Saturation % 84 %     6 Minute Liters of Oxygen 2 L     2 Minute Post Oxygen Saturation % 100 %     2 Minute Post Liters of Oxygen 2 L       Oxygen Initial Assessment:      Oxygen Initial Assessment - 12/12/16 1520      Home Oxygen   Home Oxygen Device Portable Concentrator;Home Concentrator;E-Tanks   Sleep Oxygen Prescription Continuous   Liters per minute 2   Home Exercise Oxygen Prescription Continuous   Liters per minute 2   Home at Rest Exercise Oxygen Prescription None   Compliance with Home Oxygen Use Yes     Initial 6 min Walk   Oxygen Used Pulsed   Liters per minute 2     Program Oxygen Prescription   Program Oxygen Prescription Continuous   Liters per minute 2     Intervention   Short Term Goals To learn and exhibit compliance with exercise, home and travel O2 prescription;To learn and understand importance of maintaining oxygen saturations>88%;To learn and demonstrate proper use of respiratory medications;To learn and demonstrate proper pursed lip breathing techniques or other breathing techniques.;To learn and understand importance of monitoring SPO2 with pulse oximeter and demonstrate accurate use of the pulse oximeter.   Long  Term Goals Exhibits compliance with exercise, home and travel O2 prescription;Maintenance of O2 saturations>88%;Compliance with respiratory medication;Demonstrates proper use of MDI's;Exhibits proper breathing techniques, such as pursed lip breathing or other method taught during program session;Verbalizes importance of monitoring SPO2 with pulse oximeter and return demonstration      Oxygen Re-Evaluation:   Oxygen Discharge (Final Oxygen Re-Evaluation):   Initial Exercise Prescription:     Initial Exercise Prescription - 12/12/16 1600      Date of Initial Exercise RX and Referring Provider   Date 12/12/16   Referring Provider Brett Canales MD     Oxygen   Oxygen Continuous  Liters 2     Treadmill   MPH 2.6   Grade 0.5   Minutes 15   METs 3.17     NuStep   Level 3   SPM 80   Minutes 15   METs 3     REL-XR   Level 2   Speed 50   Minutes 15   METs 3     Prescription Details    Frequency (times per week) 3   Duration Progress to 45 minutes of aerobic exercise without signs/symptoms of physical distress     Intensity   THRR 40-80% of Max Heartrate 93-136   Ratings of Perceived Exertion 11-13   Perceived Dyspnea 0-4     Progression   Progression Continue to progress workloads to maintain intensity without signs/symptoms of physical distress.     Resistance Training   Training Prescription Yes   Weight 3 lbs   Reps 10-15      Perform Capillary Blood Glucose checks as needed.  Exercise Prescription Changes:     Exercise Prescription Changes    Row Name 12/12/16 1600             Response to Exercise   Blood Pressure (Admit) 128/64       Blood Pressure (Exercise) 134/74       Blood Pressure (Exit) 122/70       Heart Rate (Admit) 51 bpm       Heart Rate (Exercise) 61 bpm       Heart Rate (Exit) 53 bpm       Oxygen Saturation (Admit) 98 %       Oxygen Saturation (Exercise) 84 %       Oxygen Saturation (Exit) 100 %       Rating of Perceived Exertion (Exercise) 13       Perceived Dyspnea (Exercise) 2       Symptoms none       Comments walk test results, wearing back brace          Exercise Comments:   Exercise Goals and Review:     Exercise Goals    Row Name 12/12/16 1613             Exercise Goals   Increase Physical Activity Yes       Intervention Provide advice, education, support and counseling about physical activity/exercise needs.;Develop an individualized exercise prescription for aerobic and resistive training based on initial evaluation findings, risk stratification, comorbidities and participant's personal goals.       Expected Outcomes Achievement of increased cardiorespiratory fitness and enhanced flexibility, muscular endurance and strength shown through measurements of functional capacity and personal statement of participant.       Increase Strength and Stamina Yes       Intervention Provide advice, education, support  and counseling about physical activity/exercise needs.;Develop an individualized exercise prescription for aerobic and resistive training based on initial evaluation findings, risk stratification, comorbidities and participant's personal goals.       Expected Outcomes Achievement of increased cardiorespiratory fitness and enhanced flexibility, muscular endurance and strength shown through measurements of functional capacity and personal statement of participant.       Able to understand and use rate of perceived exertion (RPE) scale Yes       Intervention Provide education and explanation on how to use RPE scale       Expected Outcomes Short Term: Able to use RPE daily in rehab to express subjective intensity level;Long Term:  Able to  use RPE to guide intensity level when exercising independently       Able to understand and use Dyspnea scale Yes       Intervention Provide education and explanation on how to use Dyspnea scale       Expected Outcomes Short Term: Able to use Dyspnea scale daily in rehab to express subjective sense of shortness of breath during exertion;Long Term: Able to use Dyspnea scale to guide intensity level when exercising independently       Knowledge and understanding of Target Heart Rate Range (THRR) Yes       Intervention Provide education and explanation of THRR including how the numbers were predicted and where they are located for reference       Expected Outcomes Short Term: Able to state/look up THRR;Long Term: Able to use THRR to govern intensity when exercising independently;Short Term: Able to use daily as guideline for intensity in rehab       Able to check pulse independently Yes       Intervention Provide education and demonstration on how to check pulse in carotid and radial arteries.;Review the importance of being able to check your own pulse for safety during independent exercise       Expected Outcomes Short Term: Able to explain why pulse checking is important  during independent exercise;Long Term: Able to check pulse independently and accurately       Understanding of Exercise Prescription Yes       Intervention Provide education, explanation, and written materials on patient's individual exercise prescription       Expected Outcomes Short Term: Able to explain program exercise prescription;Long Term: Able to explain home exercise prescription to exercise independently          Exercise Goals Re-Evaluation :     Exercise Goals Re-Evaluation    Row Name 12/20/16 1034             Exercise Goal Re-Evaluation   Exercise Goals Review Increase Physical Activity;Increase Strength and Stamina;Able to understand and use rate of perceived exertion (RPE) scale;Knowledge and understanding of Target Heart Rate Range (THRR)       Comments First full day of exercise!  Patient was oriented to gym and equipment including functions, settings, policies, and procedures.  Patient's individual exercise prescription and treatment plan were reviewed.  All starting workloads were established based on the results of the 6 minute walk test done at initial orientation visit.  The plan for exercise progression was also introduced and progression will be customized based on patient's performance and goals.       Expected Outcomes Short - Christy will attend exercise and education regularly.  Long - Angie will improve overall fitness level.          Discharge Exercise Prescription (Final Exercise Prescription Changes):     Exercise Prescription Changes - 12/12/16 1600      Response to Exercise   Blood Pressure (Admit) 128/64   Blood Pressure (Exercise) 134/74   Blood Pressure (Exit) 122/70   Heart Rate (Admit) 51 bpm   Heart Rate (Exercise) 61 bpm   Heart Rate (Exit) 53 bpm   Oxygen Saturation (Admit) 98 %   Oxygen Saturation (Exercise) 84 %   Oxygen Saturation (Exit) 100 %   Rating of Perceived Exertion (Exercise) 13   Perceived Dyspnea (Exercise) 2   Symptoms  none   Comments walk test results, wearing back brace      Nutrition:  Target Goals: Understanding of  nutrition guidelines, daily intake of sodium 1500mg , cholesterol 200mg , calories 30% from fat and 7% or less from saturated fats, daily to have 5 or more servings of fruits and vegetables.  Biometrics:     Pre Biometrics - 12/12/16 1616      Pre Biometrics   Height 5' 7.9" (1.725 m)   Weight 157 lb 11.2 oz (71.5 kg)   Waist Circumference 38 inches   Hip Circumference 38.25 inches   Waist to Hip Ratio 0.99 %   BMI (Calculated) 24.04       Nutrition Therapy Plan and Nutrition Goals:     Nutrition Therapy & Goals - 12/12/16 1519      Nutrition Therapy   RD appointment defered Yes     Intervention Plan   Intervention Nutrition handout(s) given to patient.;Prescribe, educate and counsel regarding individualized specific dietary modifications aiming towards targeted core components such as weight, hypertension, lipid management, diabetes, heart failure and other comorbidities.   Expected Outcomes Short Term Goal: Understand basic principles of dietary content, such as calories, fat, sodium, cholesterol and nutrients.;Long Term Goal: Adherence to prescribed nutrition plan.      Nutrition Discharge: Rate Your Plate Scores:     Nutrition Assessments - 12/12/16 1546      MEDFICTS Scores   Pre Score 70      Nutrition Goals Re-Evaluation:   Nutrition Goals Discharge (Final Nutrition Goals Re-Evaluation):   Psychosocial: Target Goals: Acknowledge presence or absence of significant depression and/or stress, maximize coping skills, provide positive support system. Participant is able to verbalize types and ability to use techniques and skills needed for reducing stress and depression.   Initial Review & Psychosocial Screening:     Initial Psych Review & Screening - 12/12/16 1505      Initial Review   Current issues with Current Depression;Current Sleep  Concerns;Current Stress Concerns   Source of Stress Concerns Chronic Illness;Unable to perform yard/household activities     Family Dynamics   Good Support System? Yes     Barriers   Psychosocial barriers to participate in program The patient should benefit from training in stress management and relaxation.     Screening Interventions   Interventions Yes;Encouraged to exercise;To provide support and resources with identified psychosocial needs   Expected Outcomes Short Term goal: Utilizing psychosocial counselor, staff and physician to assist with identification of specific Stressors or current issues interfering with healing process. Setting desired goal for each stressor or current issue identified.;Long Term Goal: Stressors or current issues are controlled or eliminated.;Short Term goal: Identification and review with participant of any Quality of Life or Depression concerns found by scoring the questionnaire.;Long Term goal: The participant improves quality of Life and PHQ9 Scores as seen by post scores and/or verbalization of changes      Quality of Life Scores:   PHQ-9: Recent Review Flowsheet Data    Depression screen [facility] 2/9 12/12/2016   Decreased Interest 2   Down, Depressed, Hopeless 2   PHQ - 2 Score 4   Altered sleeping 2   Tired, decreased energy 2   Change in appetite 0   Feeling bad or failure about yourself  2   Trouble concentrating 3   Moving slowly or fidgety/restless 3   Suicidal thoughts 0   PHQ-9 Score 16   Difficult doing work/chores Somewhat difficult     Interpretation of Total Score  Total Score Depression Severity:  1-4 = Minimal depression, 5-9 = Mild depression, 10-14 = Moderate depression, 15-19 =  Moderately severe depression, 20-27 = Severe depression   Psychosocial Evaluation and Intervention:   Psychosocial Re-Evaluation:   Psychosocial Discharge (Final Psychosocial Re-Evaluation):   Education: Education Goals: Education classes will be  provided on a weekly basis, covering required topics. Participant will state understanding/return demonstration of topics presented.  Learning Barriers/Preferences:     Learning Barriers/Preferences - 12/12/16 1518      Learning Barriers/Preferences   Learning Barriers Sight   Learning Preferences Skilled Demonstration      Education Topics: Initial Evaluation Education: - Verbal, written and demonstration of respiratory meds, RPE/PD scales, oximetry and breathing techniques. Instruction on use of nebulizers and MDIs: cleaning and proper use, rinsing mouth with steroid doses and importance of monitoring MDI activations.   Pulmonary Rehab from 12/22/2016 in [facility] Cardiac and Pulmonary Rehab  Date  12/20/16  Educator  [facility]  Instruction Review Code  1- Verbalizes Understanding      General Nutrition Guidelines/Fats and Fiber: -Group instruction provided by verbal, written material, models and posters to present the general guidelines for heart healthy nutrition. Gives an explanation and review of dietary fats and fiber.   Controlling Sodium/Reading Food Labels: -Group verbal and written material supporting the discussion of sodium use in heart healthy nutrition. Review and explanation with models, verbal and written materials for utilization of the food label.   Exercise Physiology & Risk Factors: - Group verbal and written instruction with models to review the exercise physiology of the cardiovascular system and associated critical values. Details cardiovascular disease risk factors and the goals associated with each risk factor.   Aerobic Exercise & Resistance Training: - Gives group verbal and written discussion on the health impact of inactivity. On the components of aerobic and resistive training programs and the benefits of this training and how to safely progress through these programs.   Flexibility, Balance, General Exercise Guidelines: - Provides group verbal and written  instruction on the benefits of flexibility and balance training programs. Provides general exercise guidelines with specific guidelines to those with heart or lung disease. Demonstration and skill practice provided.   Stress Management: - Provides group verbal and written instruction about the health risks of elevated stress, cause of high stress, and healthy ways to reduce stress.   Pulmonary Rehab from 12/22/2016 in [facility] Cardiac and Pulmonary Rehab  Date  12/20/16  Educator  [facility]  Instruction Review Code  1- Verbalizes Understanding      Depression: - Provides group verbal and written instruction on the correlation between heart/lung disease and depressed mood, treatment options, and the stigmas associated with seeking treatment.   Exercise & Equipment Safety: - Individual verbal instruction and demonstration of equipment use and safety with use of the equipment.   Pulmonary Rehab from 12/22/2016 in [facility] Cardiac and Pulmonary Rehab  Date  12/12/16  Educator  [facility]  Instruction Review Code  1- Verbalizes Understanding      Infection Prevention: - Provides verbal and written material to individual with discussion of infection control including proper hand washing and proper equipment cleaning during exercise session.   Pulmonary Rehab from 12/22/2016 in [facility] Cardiac and Pulmonary Rehab  Date  12/20/16  Educator  [facility]  Instruction Review Code  1- Verbalizes Understanding      Falls Prevention: - Provides verbal and written material to individual with discussion of falls prevention and safety.   Pulmonary Rehab from 12/22/2016 in [facility] Cardiac and Pulmonary Rehab  Date  12/20/16  Educator  [facility]  Instruction Review Code  1-  Verbalizes Understanding      Diabetes: - Individual verbal and written instruction to review signs/symptoms of diabetes, desired ranges of glucose level fasting, after meals and with exercise. Advice that pre and post exercise glucose checks will be done for 3 sessions  at entry of program.   Chronic Lung Diseases: - Group verbal and written instruction to review new updates, new respiratory medications, new advancements in procedures and treatments. Provide informative websites and "800" numbers of self-education.   Lung Procedures: - Group verbal and written instruction to describe testing methods done to diagnose lung disease. Review the outcome of test results. Describe the treatment choices: Pulmonary Function Tests, ABGs and oximetry.   Energy Conservation: - Provide group verbal and written instruction for methods to conserve energy, plan and organize activities. Instruct on pacing techniques, use of adaptive equipment and posture/positioning to relieve shortness of breath.   Triggers: - Group verbal and written instruction to review types of environmental controls: home humidity, furnaces, filters, dust mite/pet prevention, HEPA vacuums. To discuss weather changes, air quality and the benefits of nasal washing.   Exacerbations: - Group verbal and written instruction to provide: warning signs, infection symptoms, calling MD promptly, preventive modes, and value of vaccinations. Review: effective airway clearance, coughing and/or vibration techniques. Create an Sport and exercise psychologist.   Oxygen: - Individual and group verbal and written instruction on oxygen therapy. Includes supplement oxygen, available portable oxygen systems, continuous and intermittent flow rates, oxygen safety, concentrators, and Medicare reimbursement for oxygen.   Pulmonary Rehab from 12/22/2016 in [facility] Cardiac and Pulmonary Rehab  Date  12/20/16  Educator  [facility]  Instruction Review Code  1- Verbalizes Understanding      Respiratory Medications: - Group verbal and written instruction to review medications for lung disease. Drug class, frequency, complications, importance of spacers, rinsing mouth after steroid MDI's, and proper cleaning methods for nebulizers.   Pulmonary Rehab from  12/22/2016 in [facility] Cardiac and Pulmonary Rehab  Date  12/12/16  Educator  [facility]  Instruction Review Code  1- Verbalizes Understanding      AED/CPR: - Group verbal and written instruction with the use of models to demonstrate the basic use of the AED with the basic ABC's of resuscitation.   Pulmonary Rehab from 12/22/2016 in [facility] Cardiac and Pulmonary Rehab  Date  12/22/16  Educator  [facility]  Instruction Review Code  1- Verbalizes Understanding      Breathing Retraining: - Provides individuals verbal and written instruction on purpose, frequency, and proper technique of diaphragmatic breathing and pursed-lipped breathing. Applies individual practice skills.   Pulmonary Rehab from 12/22/2016 in [facility] Cardiac and Pulmonary Rehab  Date  12/20/16  Educator  [facility]  Instruction Review Code  1- Verbalizes Understanding      Anatomy and Physiology of the Lungs: - Group verbal and written instruction with the use of models to provide basic lung anatomy and physiology related to function, structure and complications of lung disease.   Anatomy & Physiology of the Heart: - Group verbal and written instruction and models provide basic cardiac anatomy and physiology, with the coronary electrical and arterial systems. Review of: AMI, Angina, Valve disease, Heart Failure, Cardiac Arrhythmia, Pacemakers, and the ICD.   Heart Failure: - Group verbal and written instruction on the basics of heart failure: signs/symptoms, treatments, explanation of ejection fraction, enlarged heart and cardiomyopathy.   Sleep Apnea: - Individual verbal and written instruction to review Obstructive Sleep Apnea. Review of risk factors, methods for diagnosing and types of  masks and machines for OSA.   Anxiety: - Provides group, verbal and written instruction on the correlation between heart/lung disease and anxiety, treatment options, and management of anxiety.   Relaxation: - Provides group, verbal and written instruction  about the benefits of relaxation for patients with heart/lung disease. Also provides patients with examples of relaxation techniques.   Cardiac Medications: - Group verbal and written instruction to review commonly prescribed medications for heart disease. Reviews the medication, class of the drug, and side effects.   Know Your Numbers: -Group verbal and written instruction about important numbers in your health.  Review of Cholesterol, Blood Pressure, Diabetes, and BMI and the role they play in your overall health.   Other: -Provides group and verbal instruction on various topics (see comments)    Knowledge Questionnaire Score:     Knowledge Questionnaire Score - 12/12/16 1453      Knowledge Questionnaire Score   Pre Score 8/10  Reviewed with patient       Core Components/Risk Factors/Patient Goals at Admission:     Personal Goals and Risk Factors at Admission - 12/12/16 1414      Core Components/Risk Factors/Patient Goals on Admission    Weight Management Weight Maintenance;Yes   Intervention Weight Management: Develop a combined nutrition and exercise program designed to reach desired caloric intake, while maintaining appropriate intake of nutrient and fiber, sodium and fats, and appropriate energy expenditure required for the weight goal.;Weight Management: Provide education and appropriate resources to help participant work on and attain dietary goals.   Admit Weight 157 lb 11.2 oz (71.5 kg)   Goal Weight: Short Term 157 lb (71.2 kg)   Goal Weight: Long Term 155 lb (70.3 kg)   Expected Outcomes Short Term: Continue to assess and modify interventions until short term weight is achieved;Long Term: Adherence to nutrition and physical activity/exercise program aimed toward attainment of established weight goal;Weight Maintenance: Understanding of the daily nutrition guidelines, which includes 25-35% calories from fat, 7% or less cal from saturated fats, less than 200mg   cholesterol, less than 1.5gm of sodium, & 5 or more servings of fruits and vegetables daily   Improve shortness of breath with ADL's Yes   Intervention Provide education, individualized exercise plan and daily activity instruction to help decrease symptoms of SOB with activities of daily living.   Expected Outcomes Short Term: Achieves a reduction of symptoms when performing activities of daily living.   Stress Yes   Intervention Offer individual and/or small group education and counseling on adjustment to heart disease, stress management and health-related lifestyle change. Teach and support self-help strategies.;Refer participants experiencing significant psychosocial distress to appropriate mental health specialists for further evaluation and treatment. When possible, include family members and significant others in education/counseling sessions.   Expected Outcomes Short Term: Participant demonstrates changes in health-related behavior, relaxation and other stress management skills, ability to obtain effective social support, and compliance with psychotropic medications if prescribed.;Long Term: Emotional wellbeing is indicated by absence of clinically significant psychosocial distress or social isolation.      Core Components/Risk Factors/Patient Goals Review:    Core Components/Risk Factors/Patient Goals at Discharge (Final Review):    ITP Comments:     ITP Comments    Row Name 12/12/16 1411 12/25/16 0821         ITP Comments Medical Evaluation completed. Chart sent for review and changes to Dr. Bethann Punches director of [facility]. 30 day review completed. ITP sent to Dr. Bethann Punches Director of [facility]. Continue with  ITP unless changes are made by physician.           Comments: 30 day review

## 2016-12-25 NOTE — Progress Notes (Signed)
Daily Session Note  Patient Details  Name: Bryan Montoya MRN: [id number] Date of Birth: 1956-04-02 Referring Provider:     Pulmonary Rehab from 12/12/2016 in [facility] Cardiac and Pulmonary Rehab  Referring Provider  Ancil Linsey MD      Encounter Date: 12/25/2016  Check In:     Session Check In - 12/25/16 1014      Check-In   Location [facility]-Cardiac & Pulmonary Rehab   Staff Present Nada Maclachlan, BA, ACSM CEP, Exercise Physiologist;Kelly Amedeo Plenty, BS, ACSM CEP, Exercise Physiologist;Quang Thorpe Flavia Shipper   Supervising physician immediately available to respond to emergencies [facility] immediately available ER MD   Physician(s) Dr. Clearnce Hasten and [facility]   Medication changes reported     No   Fall or balance concerns reported    No   Warm-up and Cool-down Performed as group-led instruction   Resistance Training Performed Yes   VAD Patient? No     Pain Assessment   Currently in Pain? No/denies   Multiple Pain Sites No         History  Smoking Status  . Former Smoker  . Packs/day: 1.00  . Years: 30.00  . Types: Cigarettes  . Quit date: 01/01/2006  Smokeless Tobacco  . Never Used    Comment: patient does not smoke    Goals Met:  Independence with exercise equipment Exercise tolerated well No report of cardiac concerns or symptoms Strength training completed today  Goals Unmet:  Not Applicable  Comments: Pt able to follow exercise prescription today without complaint.  Will continue to monitor for progression.   Dr. Emily Filbert is Medical Director for [place] and [facility].

## 2016-12-27 DIAGNOSIS — J449 Chronic obstructive pulmonary disease, unspecified: Secondary | ICD-10-CM | POA: Diagnosis not present

## 2016-12-27 NOTE — Progress Notes (Signed)
Daily Session Note  Patient Details  Name: Bryan Montoya MRN: [id number] Date of Birth: 1955-08-09 Referring Provider:     Pulmonary Rehab from 12/12/2016 in [facility] Cardiac and Pulmonary Rehab  Referring Provider  Ancil Linsey MD      Encounter Date: 12/27/2016  Check In:     Session Check In - 12/27/16 1007      Check-In   Location [facility]-Cardiac & Pulmonary Rehab   Staff Present Alberteen Sam, MA, ACSM RCEP, Exercise Physiologist;Amanda Oletta Darter, BA, ACSM CEP, Exercise Physiologist;Rylynne Schicker Flavia Shipper   Supervising physician immediately available to respond to emergencies [facility] immediately available ER MD   Physician(s) Dr. Burlene Arnt and Joni Fears   Medication changes reported     No   Fall or balance concerns reported    No   Warm-up and Cool-down Performed as group-led instruction   Resistance Training Performed Yes   VAD Patient? No     Pain Assessment   Currently in Pain? No/denies   Multiple Pain Sites No         History  Smoking Status  . Former Smoker  . Packs/day: 1.00  . Years: 30.00  . Types: Cigarettes  . Quit date: 01/01/2006  Smokeless Tobacco  . Never Used    Comment: patient does not smoke    Goals Met:  Independence with exercise equipment Exercise tolerated well No report of cardiac concerns or symptoms Strength training completed today  Goals Unmet:  Not Applicable  Comments: Pt able to follow exercise prescription today without complaint.  Will continue to monitor for progression. Reviewed home exercise with pt today.  Pt plans to walk everyday when he is not at [facility] for exercise.  Reviewed THR, pulse, RPE, sign and symptoms, NTG use, and when to call 911 or MD.  Also discussed weather considerations and indoor options.  Pt voiced understanding.   Dr. Emily Filbert is Medical Director for [place] and [facility].

## 2016-12-29 DIAGNOSIS — J449 Chronic obstructive pulmonary disease, unspecified: Secondary | ICD-10-CM

## 2016-12-29 NOTE — Progress Notes (Signed)
Daily Session Note  Patient Details  Name: Bryan Montoya MRN: [id number] Date of Birth: 1955-04-13 Referring Provider:     Pulmonary Rehab from 12/12/2016 in [facility] Cardiac and Pulmonary Rehab  Referring Provider  Ancil Linsey MD      Encounter Date: 12/29/2016  Check In:     Session Check In - 12/29/16 1011      Check-In   Location [facility]-Cardiac & Pulmonary Rehab   Staff Present Nada Maclachlan, BA, ACSM CEP, Exercise Physiologist;Erroll Wilbourne Christain Sacramento, RN BSN   Supervising physician immediately available to respond to emergencies [facility] immediately available ER MD   Physician(s) Dr. Mable Paris and Reita Cliche   Medication changes reported     No   Fall or balance concerns reported    No   Warm-up and Cool-down Performed as group-led instruction   Resistance Training Performed Yes   VAD Patient? No     Pain Assessment   Currently in Pain? No/denies   Multiple Pain Sites No         History  Smoking Status  . Former Smoker  . Packs/day: 1.00  . Years: 30.00  . Types: Cigarettes  . Quit date: 01/01/2006  Smokeless Tobacco  . Never Used    Comment: patient does not smoke    Goals Met:  Independence with exercise equipment Exercise tolerated well No report of cardiac concerns or symptoms Strength training completed today  Goals Unmet:  Not Applicable  Comments: Pt able to follow exercise prescription today without complaint.  Will continue to monitor for progression.   Dr. Emily Filbert is Medical Director for [place] and [facility].

## 2017-01-01 ENCOUNTER — Encounter: Payer: Managed Care, Other (non HMO) | Attending: Specialist

## 2017-01-01 DIAGNOSIS — J449 Chronic obstructive pulmonary disease, unspecified: Secondary | ICD-10-CM | POA: Diagnosis present

## 2017-01-01 DIAGNOSIS — Z87891 Personal history of nicotine dependence: Secondary | ICD-10-CM | POA: Diagnosis not present

## 2017-01-01 NOTE — Progress Notes (Signed)
Daily Session Note  Patient Details  Name: Bryan Montoya MRN: [id number] Date of Birth: Oct 21, 1955 Referring Provider:     [facility]  Referring Provider  Ancil Linsey MD      Encounter Date: 01/01/2017  Check In:     Session Check In - 01/01/17 1023      Check-In   Location [facility]-Cardiac & Pulmonary Rehab   Staff Present Nada Maclachlan, BA, ACSM CEP, Exercise Physiologist;Kelly Amedeo Plenty, BS, ACSM CEP, Exercise Physiologist;Mory Herrman Flavia Shipper   Supervising physician immediately available to respond to emergencies [facility] immediately available ER MD   Physician(s) Dr. Mariea Clonts and Alfred Levins   Medication changes reported     No   Fall or balance concerns reported    No   Warm-up and Cool-down Performed as group-led instruction   Resistance Training Performed Yes   VAD Patient? No     Pain Assessment   Currently in Pain? No/denies   Multiple Pain Sites No         History  Smoking Status  . Former Smoker  . Packs/day: 1.00  . Years: 30.00  . Types: Cigarettes  . Quit date: 01/01/2006  Smokeless Tobacco  . Never Used    Comment: patient does not smoke    Goals Met:  Independence with exercise equipment Exercise tolerated well No report of cardiac concerns or symptoms Strength training completed today  Goals Unmet:  Not Applicable  Comments: Pt able to follow exercise prescription today without complaint.  Will continue to monitor for progression.   Dr. Emily Filbert is Medical Director for [place] and [facility].

## 2017-01-03 DIAGNOSIS — J449 Chronic obstructive pulmonary disease, unspecified: Secondary | ICD-10-CM

## 2017-01-03 NOTE — Progress Notes (Signed)
Daily Session Note  Patient Details  Name: Bryan Montoya MRN: [id number] Date of Birth: 1955/07/19 Referring Provider:     Pulmonary Rehab from 12/12/2016 in [facility] Cardiac and Pulmonary Rehab  Referring Provider  Ancil Linsey MD      Encounter Date: 01/03/2017  Check In:     Session Check In - 01/03/17 1022      Check-In   Location [facility]-Cardiac & Pulmonary Rehab   Staff Present Alberteen Sam, MA, ACSM RCEP, Exercise Physiologist;Amanda Oletta Darter, BA, ACSM CEP, Exercise Physiologist;Tymesha Ditmore Flavia Shipper   Supervising physician immediately available to respond to emergencies [facility] immediately available ER MD   Physician(s) Dr. Clearnce Hasten and Jimmye Norman   Medication changes reported     No   Fall or balance concerns reported    No   Warm-up and Cool-down Performed as group-led instruction   Resistance Training Performed Yes   VAD Patient? No     Pain Assessment   Currently in Pain? No/denies   Multiple Pain Sites No         History  Smoking Status  . Former Smoker  . Packs/day: 1.00  . Years: 30.00  . Types: Cigarettes  . Quit date: 01/01/2006  Smokeless Tobacco  . Never Used    Comment: patient does not smoke    Goals Met:  Independence with exercise equipment Exercise tolerated well No report of cardiac concerns or symptoms Strength training completed today  Goals Unmet:  Not Applicable  Comments: Pt able to follow exercise prescription today without complaint.  Will continue to monitor for progression.   Dr. Emily Filbert is Medical Director for [place] and [facility].

## 2017-01-05 ENCOUNTER — Encounter: Payer: Managed Care, Other (non HMO) | Admitting: *Deleted

## 2017-01-05 DIAGNOSIS — J449 Chronic obstructive pulmonary disease, unspecified: Secondary | ICD-10-CM | POA: Diagnosis not present

## 2017-01-05 NOTE — Progress Notes (Signed)
Daily Session Note  Patient Details  Name: Bryan Montoya MRN: [id number] Date of Birth: Jul 05, 1955 Referring Provider:     Pulmonary Rehab from 12/12/2016 in [facility] Cardiac and Pulmonary Rehab  Referring Provider  Ancil Linsey MD      Encounter Date: 01/05/2017  Check In:     Session Check In - 01/05/17 1021      Check-In   Location [facility]Cardiac & Pulmonary Rehab   Staff Present Gerlene Burdock, RN, Vickki Hearing, BA, ACSM CEP, Exercise Physiologist;Joseph Flavia Shipper   Supervising physician immediately available to respond to emergencies [facility] immediately available ER MD   Physician(s) Dr. Corky Downs and Dr. Reita Cliche   Medication changes reported     No   Fall or balance concerns reported    No   Tobacco Cessation No Change   Warm-up and Cool-down Performed as group-led instruction   Resistance Training Performed Yes   VAD Patient? No     Pain Assessment   Currently in Pain? No/denies         History  Smoking Status  . Former Smoker  . Packs/day: 1.00  . Years: 30.00  . Types: Cigarettes  . Quit date: 01/01/2006  Smokeless Tobacco  . Never Used    Comment: patient does not smoke    Goals Met:  Proper associated with RPD/PD & O2 Sat Exercise tolerated well Strength training completed today  Goals Unmet:  Not Applicable  Comments: Pt able to follow exercise prescription today without complaint.  Will continue to monitor for progression.     Dr. Emily Filbert is Medical Director for [place] and [facility].

## 2017-01-08 DIAGNOSIS — J449 Chronic obstructive pulmonary disease, unspecified: Secondary | ICD-10-CM

## 2017-01-08 NOTE — Progress Notes (Signed)
Daily Session Note  Patient Details  Name: Bryan Montoya MRN: [id number] Date of Birth: [birth year] Referring Provider:     Pulmonary Rehab from 12/12/2016 in [facility] Cardiac and Pulmonary Rehab  Referring Provider  Ancil Linsey MD      Encounter Date: 01/08/2017  Check In:     Session Check In - 01/08/17 0952      Check-In   Location [facility]-Cardiac & Pulmonary Rehab   Staff Present Nada Maclachlan, BA, ACSM CEP, Exercise Physiologist;Kelly Amedeo Plenty, BS, ACSM CEP, Exercise Physiologist;Amahri Dengel Flavia Shipper   Supervising physician immediately available to respond to emergencies [facility] immediately available ER MD   Physician(s) Dr. Jimmye Norman and [facility]   Medication changes reported     No   Fall or balance concerns reported    No   Warm-up and Cool-down Performed as group-led instruction   Resistance Training Performed Yes   VAD Patient? No     Pain Assessment   Currently in Pain? No/denies   Multiple Pain Sites No         History  Smoking Status  . Former Smoker  . Packs/day: 1.00  . Years: 30.00  . Types: Cigarettes  . Quit date: 01/01/2006  Smokeless Tobacco  . Never Used    Comment: patient does not smoke    Goals Met:  Independence with exercise equipment Exercise tolerated well No report of cardiac concerns or symptoms Strength training completed today  Goals Unmet:  Not Applicable  Comments: Pt able to follow exercise prescription today without complaint.  Will continue to monitor for progression.   Dr. Emily Filbert is Medical Director for [place] and [facility].

## 2017-01-10 DIAGNOSIS — J449 Chronic obstructive pulmonary disease, unspecified: Secondary | ICD-10-CM | POA: Diagnosis not present

## 2017-01-10 NOTE — Progress Notes (Signed)
Daily Session Note  Patient Details  Name: Bryan Montoya MRN: [id number] Date of Birth: 1956/02/27 Referring Provider:     Pulmonary Rehab from 12/12/2016 in [facility] Cardiac and Pulmonary Rehab  Referring Provider  Ancil Linsey MD      Encounter Date: 01/10/2017  Check In:     Session Check In - 01/10/17 0955      Check-In   Location [facility]-Cardiac & Pulmonary Rehab   Staff Present Alberteen Sam, MA, ACSM RCEP, Exercise Physiologist;Amanda Oletta Darter, BA, ACSM CEP, Exercise Physiologist;Xue Low Flavia Shipper   Supervising physician immediately available to respond to emergencies [facility] immediately available ER MD   Physician(s) Dr. Jimmye Norman and Cinda Quest   Medication changes reported     No   Fall or balance concerns reported    No   Warm-up and Cool-down Performed as group-led instruction   Resistance Training Performed Yes   VAD Patient? No     Pain Assessment   Currently in Pain? No/denies   Multiple Pain Sites No         History  Smoking Status  . Former Smoker  . Packs/day: 1.00  . Years: 30.00  . Types: Cigarettes  . Quit date: 01/01/2006  Smokeless Tobacco  . Never Used    Comment: patient does not smoke    Goals Met:  Independence with exercise equipment Exercise tolerated well No report of cardiac concerns or symptoms Strength training completed today  Goals Unmet:  Not Applicable  Comments: Pt able to follow exercise prescription today without complaint.  Will continue to monitor for progression.   Dr. Emily Filbert is Medical Director for [place] and [facility].

## 2017-01-12 DIAGNOSIS — J449 Chronic obstructive pulmonary disease, unspecified: Secondary | ICD-10-CM

## 2017-01-12 NOTE — Progress Notes (Signed)
Daily Session Note  Patient Details  Name: MIKIE MISNER MRN: [id number] Date of Birth: [birth year] Referring Provider:     Pulmonary Rehab from 12/12/2016 in [facility] Cardiac and Pulmonary Rehab  Referring Provider  Ancil Linsey MD      Encounter Date: 01/12/2017  Check In:     Session Check In - 01/12/17 0956      Check-In   Location [facility]Cardiac & Pulmonary Rehab   Staff Present Nada Maclachlan, BA, ACSM CEP, Exercise Physiologist;Meredith Sherryll Burger, RN BSN;Colton Engdahl Flavia Shipper   Supervising physician immediately available to respond to emergencies [facility] immediately available ER MD   Physician(s) Dr. Cinda Quest and Clearnce Hasten   Medication changes reported     No   Fall or balance concerns reported    No   Warm-up and Cool-down Performed as group-led instruction   Resistance Training Performed Yes   VAD Patient? No     Pain Assessment   Currently in Pain? No/denies   Multiple Pain Sites No         History  Smoking Status  . Former Smoker  . Packs/day: 1.00  . Years: 30.00  . Types: Cigarettes  . Quit date: 01/01/2006  Smokeless Tobacco  . Never Used    Comment: patient does not smoke    Goals Met:  Independence with exercise equipment Exercise tolerated well No report of cardiac concerns or symptoms Strength training completed today  Goals Unmet:  Not Applicable  Comments: Pt able to follow exercise prescription today without complaint.  Will continue to monitor for progression.   Dr. Emily Filbert is Medical Director for [place] and [facility].

## 2017-01-15 DIAGNOSIS — J449 Chronic obstructive pulmonary disease, unspecified: Secondary | ICD-10-CM

## 2017-01-15 NOTE — Progress Notes (Signed)
Daily Session Note  Patient Details  Name: Bryan Montoya MRN: [id number] Date of Birth: 1955-04-18 Referring Provider:     Pulmonary Rehab from 12/12/2016 in [facility] Cardiac and Pulmonary Rehab  Referring Provider  Ancil Linsey MD      Encounter Date: 01/15/2017  Check In:     Session Check In - 01/15/17 0952      Check-In   Location [facility]-Cardiac & Pulmonary Rehab   Staff Present Carson Myrtle, BS, RRT, Respiratory Bertis Ruddy, BS, ACSM CEP, Exercise Physiologist;Yan Pankratz Flavia Shipper   Supervising physician immediately available to respond to emergencies [facility] immediately available ER MD   Physician(s) Dr. Cherylann Banas and Corky Downs   Medication changes reported     No   Fall or balance concerns reported    No   Warm-up and Cool-down Performed as group-led instruction   Resistance Training Performed Yes   VAD Patient? No     Pain Assessment   Currently in Pain? No/denies   Multiple Pain Sites No         History  Smoking Status  . Former Smoker  . Packs/day: 1.00  . Years: 30.00  . Types: Cigarettes  . Quit date: 01/01/2006  Smokeless Tobacco  . Never Used    Comment: patient does not smoke    Goals Met:  Proper associated with RPD/PD & O2 Sat Independence with exercise equipment Exercise tolerated well No report of cardiac concerns or symptoms Strength training completed today  Goals Unmet:  Not Applicable  Comments: Pt able to follow exercise prescription today without complaint.  Will continue to monitor for progression.    Dr. Emily Filbert is Medical Director for [place] and [facility].

## 2017-01-17 DIAGNOSIS — J449 Chronic obstructive pulmonary disease, unspecified: Secondary | ICD-10-CM

## 2017-01-17 NOTE — Progress Notes (Signed)
Daily Session Note  Patient Details  Name: Bryan Montoya MRN: [id number] Date of Birth: 03-12-1956 Referring Provider:     Pulmonary Rehab from 12/12/2016 in [facility] Cardiac and Pulmonary Rehab  Referring Provider  Ancil Linsey MD      Encounter Date: 01/17/2017  Check In:     Session Check In - 01/17/17 0957      Check-In   Location [facility]-Cardiac & Pulmonary Rehab   Staff Present Nada Maclachlan, BA, ACSM CEP, Exercise Physiologist;Krista Frederico Hamman, RN BSN;Krystalyn Kubota Flavia Shipper   Supervising physician immediately available to respond to emergencies [facility] immediately available ER MD   Physician(s) Dr. Mariea Clonts and Joni Fears   Medication changes reported     No   Fall or balance concerns reported    No   Warm-up and Cool-down Performed as group-led instruction   Resistance Training Performed Yes   VAD Patient? No     Pain Assessment   Currently in Pain? No/denies   Multiple Pain Sites No         History  Smoking Status  . Former Smoker  . Packs/day: 1.00  . Years: 30.00  . Types: Cigarettes  . Quit date: 01/01/2006  Smokeless Tobacco  . Never Used    Comment: patient does not smoke    Goals Met:  Independence with exercise equipment Exercise tolerated well No report of cardiac concerns or symptoms Strength training completed today  Goals Unmet:  Not Applicable  Comments: Pt able to follow exercise prescription today without complaint.  Will continue to monitor for progression.   Dr. Emily Filbert is Medical Director for [place] and [facility].

## 2017-01-19 DIAGNOSIS — J449 Chronic obstructive pulmonary disease, unspecified: Secondary | ICD-10-CM | POA: Diagnosis not present

## 2017-01-19 NOTE — Progress Notes (Signed)
Daily Session Note  Patient Details  Name: Bryan Montoya MRN: [id number] Date of Birth: 26-Oct-1955 Referring Provider:     Pulmonary Rehab from 12/12/2016 in [facility] Cardiac and Pulmonary Rehab  Referring Provider  Ancil Linsey MD      Encounter Date: 01/19/2017  Check In:     Session Check In - 01/19/17 0956      Check-In   Location [facility]-Cardiac & Pulmonary Rehab   Staff Present Nada Maclachlan, BA, ACSM CEP, Exercise Physiologist;Reginaldo [place] Alcus Dad, RN BSN   Supervising physician immediately available to respond to emergencies [facility] immediately available ER MD   Physician(s) Dr. Kerman Passey and Quentin Cornwall   Medication changes reported     No   Fall or balance concerns reported    No   Warm-up and Cool-down Performed as group-led instruction   Resistance Training Performed Yes   VAD Patient? No     Pain Assessment   Currently in Pain? No/denies   Multiple Pain Sites No         History  Smoking Status  . Former Smoker  . Packs/day: 1.00  . Years: 30.00  . Types: Cigarettes  . Quit date: 01/01/2006  Smokeless Tobacco  . Never Used    Comment: patient does not smoke    Goals Met:  Independence with exercise equipment Exercise tolerated well No report of cardiac concerns or symptoms Strength training completed today  Goals Unmet:  Not Applicable  Comments: Pt able to follow exercise prescription today without complaint.  Will continue to monitor for progression.   Dr. Emily Filbert is Medical Director for [place] and [facility].

## 2017-01-22 DIAGNOSIS — J449 Chronic obstructive pulmonary disease, unspecified: Secondary | ICD-10-CM | POA: Diagnosis not present

## 2017-01-22 NOTE — Progress Notes (Signed)
Daily Session Note  Patient Details  Name: Bryan Montoya MRN: [id number] Date of Birth: [birth year] Referring Provider:     Pulmonary Rehab from 12/12/2016 in [facility]  Referring Provider  Ancil Linsey MD      Encounter Date: 01/22/2017  Check In:     Session Check In - 01/22/17 1018      Check-In   Location [facility]-Card[facility] & Pul[facility]   Staff Present Nada Maclachlan, BA, ACSM CEP, Exercise Physiologist;Kelly Amedeo Plenty, BS, ACSM CEP, Exercise Physiologist;Liany Mumpower Flavia Shipper   Supervising physician immediately available to respond to emergencies [facility] immediately available ER MD   Physician(s) Dr. Kerman Passey and Mariea Clonts   Medication changes reported     No   Fall or balance concerns reported    No   Warm-up and Cool-down Performed as group-led instruction   Resistance Training Performed Yes   VAD Patient? No     Pain Assessment   Currently in Pain? No/denies   Multiple Pain Sites No         History  Smoking Status  . Former Smoker  . Packs/day: 1.00  . Years: 30.00  . Types: Cigarettes  . Quit date: 01/01/2006  Smokeless Tobacco  . Never Used    Comment: patient does not smoke    Goals Met:  Independence with exercise equipment Exercise tolerated well No report of cardiac concerns or symptoms Strength training completed today  Goals Unmet:  Not Applicable  Comments: Pt able to follow exercise prescription today without complaint.  Will continue to monitor for progression.   Dr. Emily Filbert is Medical Director for [facility].

## 2017-01-22 NOTE — Progress Notes (Signed)
Pulmonary Individual Treatment Plan  Patient Details  Name: NHAT HEARNE MRN: [id number] Date of Birth: 15-Jul-1955 Referring Provider:     Pulmonary Rehab from 12/12/2016 in [facility] Cardiac and Pulmonary Rehab  Referring Provider  Ancil Linsey MD      Initial Encounter Date:    Pulmonary Rehab from 12/12/2016 in [facility] Cardiac and Pulmonary Rehab  Date  12/12/16  Referring Provider  Ancil Linsey MD      Visit Diagnosis: Chronic obstructive pulmonary disease, unspecified COPD type ([place])  Patient's Home Medications on Admission:  Current Outpatient Prescriptions:  .  albuterol (PROVENTIL HFA;VENTOLIN HFA) 108 (90 Base) MCG/ACT inhaler, Inhale into the lungs., Disp: , Rfl:  .  fluticasone (FLONASE) 50 MCG/ACT nasal spray, Place into the nose., Disp: , Rfl:  .  fluticasone furoate-vilanterol (BREO ELLIPTA) 100-25 MCG/INH AEPB, Inhale into the lungs., Disp: , Rfl:  .  gabapentin (NEURONTIN) 300 MG capsule, Take 1-3 tablets nightly prn restless leg, Disp: , Rfl:  .  loratadine (CLARITIN) 10 MG tablet, Take by mouth., Disp: , Rfl:  .  Melatonin 5 MG TABS, 10 mg. Take 1 tablet by mouth at bedtime as needed, Disp: , Rfl:  .  meloxicam (MOBIC) 7.5 MG tablet, Take by mouth., Disp: , Rfl:  .  Respiratory Therapy Supplies (FLUTTER) DEVI, Use 1 each 3 (three) times daily., Disp: , Rfl:  .  umeclidinium bromide (INCRUSE ELLIPTA) 62.5 MCG/INH AEPB, Inhale into the lungs., Disp: , Rfl:   Past Medical History: No past medical history on file.  Tobacco Use: History  Smoking Status  . Former Smoker  . Packs/day: 1.00  . Years: 30.00  . Types: Cigarettes  . Quit date: 01/01/2006  Smokeless Tobacco  . Never Used    Comment: patient does not smoke    Labs: Recent Review Flowsheet Data    There is no flowsheet data to display.       Pulmonary Assessment Scores:     Pulmonary Assessment Scores    Row Name 12/12/16 1459         ADL UCSD   ADL Phase Entry     SOB Score total 44      Rest 0     Walk 1     Stairs 5     Bath 2     Dress 1     Shop 1       CAT Score   CAT Score 22       mMRC Score   mMRC Score 2        Pulmonary Function Assessment:     Pulmonary Function Assessment - 12/12/16 1513      Initial Spirometry Results   FVC% 71 %   FEV1% 49 %   FEV1/FVC Ratio 52.34     Post Bronchodilator Spirometry Results   FVC% 66.03 %   FEV1% 47.04 %   FEV1/FVC Ratio 54     Breath   Bilateral Breath Sounds Clear;Decreased   Shortness of Breath Limiting activity;Yes      Exercise Target Goals:    Exercise Program Goal: Individual exercise prescription set with THRR, safety & activity barriers. Participant demonstrates ability to understand and report RPE using BORG scale, to self-measure pulse accurately, and to acknowledge the importance of the exercise prescription.  Exercise Prescription Goal: Starting with aerobic activity 30 plus minutes a day, 3 days per week for initial exercise prescription. Provide home exercise prescription and guidelines that participant acknowledges understanding prior to discharge.  Activity Barriers & Risk Stratification:     Activity Barriers & Cardiac Risk Stratification - 12/12/16 1612      Activity Barriers & Cardiac Risk Stratification   Activity Barriers Back Problems;Neck/Spine Problems;Shortness of Breath;Muscular Weakness;Deconditioning  back surgery 4 months ago (fusion L4-S1)      6 Minute Walk:     6 Minute Walk    Row Name 12/12/16 1610         6 Minute Walk   Phase Initial     Distance 1420 feet  wearing back brace     Walk Time 6 minutes     # of Rest Breaks 0     MPH 2.69     METS 3.34     RPE 13     Perceived Dyspnea  2     VO2 Peak 11.7     Symptoms No     Resting HR 51 bpm     Resting BP 128/64     Resting Oxygen Saturation  98 %     Exercise Oxygen Saturation  during 6 min walk 84 %     Max Ex. HR 61 bpm     Max Ex. BP 134/74     2 Minute Post BP 122/70        Interval HR   1 Minute HR 58     2 Minute HR 61     4 Minute HR 58     5 Minute HR 58     6 Minute HR 61     2 Minute Post HR 53     Interval Heart Rate? Yes       Interval Oxygen   Interval Oxygen? Yes     Baseline Oxygen Saturation % 98 %     1 Minute Oxygen Saturation % 96 %     1 Minute Liters of Oxygen 2 L  pulsed     2 Minute Oxygen Saturation % 94 %     2 Minute Liters of Oxygen 2 L     3 Minute Oxygen Saturation % 89 %     3 Minute Liters of Oxygen 2 L     4 Minute Oxygen Saturation % 91 %     4 Minute Liters of Oxygen 2 L     5 Minute Oxygen Saturation % 92 %     5 Minute Liters of Oxygen 2 L     6 Minute Oxygen Saturation % 84 %     6 Minute Liters of Oxygen 2 L     2 Minute Post Oxygen Saturation % 100 %     2 Minute Post Liters of Oxygen 2 L       Oxygen Initial Assessment:     Oxygen Initial Assessment - 12/12/16 1520      Home Oxygen   Home Oxygen Device Portable Concentrator;Home Concentrator;E-Tanks   Sleep Oxygen Prescription Continuous   Liters per minute 2   Home Exercise Oxygen Prescription Continuous   Liters per minute 2   Home at Rest Exercise Oxygen Prescription None   Compliance with Home Oxygen Use Yes     Initial 6 min Walk   Oxygen Used Pulsed   Liters per minute 2     Program Oxygen Prescription   Program Oxygen Prescription Continuous   Liters per minute 2     Intervention   Short Term Goals To learn and exhibit compliance with exercise, home and travel O2 prescription;To learn and understand  importance of maintaining oxygen saturations>88%;To learn and demonstrate proper use of respiratory medications;To learn and demonstrate proper pursed lip breathing techniques or other breathing techniques.;To learn and understand importance of monitoring SPO2 with pulse oximeter and demonstrate accurate use of the pulse oximeter.   Long  Term Goals Exhibits compliance with exercise, home and travel O2 prescription;Maintenance of O2  saturations>88%;Compliance with respiratory medication;Demonstrates proper use of MDI's;Exhibits proper breathing techniques, such as pursed lip breathing or other method taught during program session;Verbalizes importance of monitoring SPO2 with pulse oximeter and return demonstration      Oxygen Re-Evaluation:     Oxygen Re-Evaluation    Row Name 01/10/17 1047             Program Oxygen Prescription   Program Oxygen Prescription Continuous       Liters per minute 2         Home Oxygen   Home Oxygen Device Portable Concentrator;Home Concentrator;E-Tanks       Sleep Oxygen Prescription Continuous       Liters per minute 2       Home Exercise Oxygen Prescription Continuous       Liters per minute 2       Home at Rest Exercise Oxygen Prescription None       Compliance with Home Oxygen Use Yes         Goals/Expected Outcomes   Short Term Goals To learn and exhibit compliance with exercise, home and travel O2 prescription;To learn and understand importance of maintaining oxygen saturations>88%;To learn and demonstrate proper pursed lip breathing techniques or other breathing techniques.;To learn and understand importance of monitoring SPO2 with pulse oximeter and demonstrate accurate use of the pulse oximeter.;To learn and demonstrate proper use of respiratory medications       Long  Term Goals Exhibits compliance with exercise, home and travel O2 prescription;Maintenance of O2 saturations>88%;Compliance with respiratory medication;Demonstrates proper use of MDI's;Exhibits proper breathing techniques, such as pursed lip breathing or other method taught during program session;Verbalizes importance of monitoring SPO2 with pulse oximeter and return demonstration       Comments Clair Gulling is taking is Breo as prescribed and using his spacer with his albuterol inhaler when he needs it. He takes his albuterol inhaler about twice a week. Informed him to work on PLB when he feels short of breath. He has  a pulse oximter to check his oxygen levels and verbalizes understanding to keep his oxygen staturation above 88 percent.       Goals/Expected Outcomes Short: Become more profiecient at using PLB.   Long: Become independent at using PLB.          Oxygen Discharge (Final Oxygen Re-Evaluation):     Oxygen Re-Evaluation - 01/10/17 1047      Program Oxygen Prescription   Program Oxygen Prescription Continuous   Liters per minute 2     Home Oxygen   Home Oxygen Device Portable Concentrator;Home Concentrator;E-Tanks   Sleep Oxygen Prescription Continuous   Liters per minute 2   Home Exercise Oxygen Prescription Continuous   Liters per minute 2   Home at Rest Exercise Oxygen Prescription None   Compliance with Home Oxygen Use Yes     Goals/Expected Outcomes   Short Term Goals To learn and exhibit compliance with exercise, home and travel O2 prescription;To learn and understand importance of maintaining oxygen saturations>88%;To learn and demonstrate proper pursed lip breathing techniques or other breathing techniques.;To learn and understand importance of monitoring SPO2 with pulse oximeter  and demonstrate accurate use of the pulse oximeter.;To learn and demonstrate proper use of respiratory medications   Long  Term Goals Exhibits compliance with exercise, home and travel O2 prescription;Maintenance of O2 saturations>88%;Compliance with respiratory medication;Demonstrates proper use of MDI's;Exhibits proper breathing techniques, such as pursed lip breathing or other method taught during program session;Verbalizes importance of monitoring SPO2 with pulse oximeter and return demonstration   Comments Clair Gulling is taking is Breo as prescribed and using his spacer with his albuterol inhaler when he needs it. He takes his albuterol inhaler about twice a week. Informed him to work on PLB when he feels short of breath. He has a pulse oximter to check his oxygen levels and verbalizes understanding to keep his  oxygen staturation above 88 percent.   Goals/Expected Outcomes Short: Become more profiecient at using PLB.   Long: Become independent at using PLB.      Initial Exercise Prescription:     Initial Exercise Prescription - 12/12/16 1600      Date of Initial Exercise RX and Referring Provider   Date 12/12/16   Referring Provider Ancil Linsey MD     Oxygen   Oxygen Continuous   Liters 2     Treadmill   MPH 2.6   Grade 0.5   Minutes 15   METs 3.17     NuStep   Level 3   SPM 80   Minutes 15   METs 3     REL-XR   Level 2   Speed 50   Minutes 15   METs 3     Prescription Details   Frequency (times per week) 3   Duration Progress to 45 minutes of aerobic exercise without signs/symptoms of physical distress     Intensity   THRR 40-80% of Max Heartrate 93-136   Ratings of Perceived Exertion 11-13   Perceived Dyspnea 0-4     Progression   Progression Continue to progress workloads to maintain intensity without signs/symptoms of physical distress.     Resistance Training   Training Prescription Yes   Weight 3 lbs   Reps 10-15      Perform Capillary Blood Glucose checks as needed.  Exercise Prescription Changes:     Exercise Prescription Changes    Row Name 12/12/16 1600 12/27/16 1100 12/27/16 1200 01/10/17 1200       Response to Exercise   Blood Pressure (Admit) 128/64  - 144/70 128/72    Blood Pressure (Exercise) 134/74  -  -  -    Blood Pressure (Exit) 122/70  - 120/60 120/68    Heart Rate (Admit) 51 bpm  - 58 bpm 60 bpm    Heart Rate (Exercise) 61 bpm  - 92 bpm 110 bpm    Heart Rate (Exit) 53 bpm  - 61 bpm 66 bpm    Oxygen Saturation (Admit) 98 %  - 95 % 96 %    Oxygen Saturation (Exercise) 84 %  - 92 % 91 %    Oxygen Saturation (Exit) 100 %  - 95 % 93 %    Rating of Perceived Exertion (Exercise) 13  - 13 13    Perceived Dyspnea (Exercise) 2  - 2 2    Symptoms none  - none none    Comments walk test results, wearing back brace  -  -  -     Duration  -  - Continue with 45 min of aerobic exercise without signs/symptoms of physical distress. Continue with 45 min of aerobic  exercise without signs/symptoms of physical distress.    Intensity  -  - THRR unchanged THRR unchanged      Progression   Progression  -  -  - Continue to progress workloads to maintain intensity without signs/symptoms of physical distress.    Average METs  -  -  - 4.17      Resistance Training   Training Prescription  - Yes Yes Yes    Weight  - 3 lbs 3 lbs 7 lb    Reps  - 10-15 10-15 10-15      Oxygen   Oxygen  - Continuous Continuous Continuous    Liters  - 2 2  TM only 2  Tm only      Treadmill   MPH  - 2.6 2.6 2.8    Grade  - 0.5 0.5 1.5    Minutes  - 15 15 15     METs  - 3.17 3.17 3.72      NuStep   Level  - 3 3 4     SPM  - 80 71 90    Minutes  - 15 15 15     METs  - 3 2 3.2      REL-XR   Level  - 2 3 5     Speed  - 50 53 68    Minutes  - 15 15 15     METs  - 3 3.4 5.6      Home Exercise Plan   Plans to continue exercise at  - Home (comment)  He is thinking of joinin [employer]  -  -    Frequency  - Add 4 additional days to program exercise sessions.  -  -    Initial Home Exercises Provided  - 12/27/16  -  -       Exercise Comments:     Exercise Comments    Row Name 12/27/16 1115           Exercise Comments Reviewed home exercise with pt today.  Pt plans to walk everyday when he is not at [facility] for exercise.  Reviewed THR, pulse, RPE, sign and symptoms, NTG use, and when to call 911 or MD.  Also discussed weather considerations and indoor options.  Pt voiced understanding.          Exercise Goals and Review:     Exercise Goals    Row Name 12/12/16 1613             Exercise Goals   Increase Physical Activity Yes       Intervention Provide advice, education, support and counseling about physical activity/exercise needs.;Develop an individualized exercise prescription for aerobic and resistive training based on  initial evaluation findings, risk stratification, comorbidities and participant's personal goals.       Expected Outcomes Achievement of increased cardiorespiratory fitness and enhanced flexibility, muscular endurance and strength shown through measurements of functional capacity and personal statement of participant.       Increase Strength and Stamina Yes       Intervention Provide advice, education, support and counseling about physical activity/exercise needs.;Develop an individualized exercise prescription for aerobic and resistive training based on initial evaluation findings, risk stratification, comorbidities and participant's personal goals.       Expected Outcomes Achievement of increased cardiorespiratory fitness and enhanced flexibility, muscular endurance and strength shown through measurements of functional capacity and personal statement of participant.       Able to understand and use rate of  perceived exertion (RPE) scale Yes       Intervention Provide education and explanation on how to use RPE scale       Expected Outcomes Short Term: Able to use RPE daily in rehab to express subjective intensity level;Long Term:  Able to use RPE to guide intensity level when exercising independently       Able to understand and use Dyspnea scale Yes       Intervention Provide education and explanation on how to use Dyspnea scale       Expected Outcomes Short Term: Able to use Dyspnea scale daily in rehab to express subjective sense of shortness of breath during exertion;Long Term: Able to use Dyspnea scale to guide intensity level when exercising independently       Knowledge and understanding of Target Heart Rate Range (THRR) Yes       Intervention Provide education and explanation of THRR including how the numbers were predicted and where they are located for reference       Expected Outcomes Short Term: Able to state/look up THRR;Long Term: Able to use THRR to govern intensity when exercising  independently;Short Term: Able to use daily as guideline for intensity in rehab       Able to check pulse independently Yes       Intervention Provide education and demonstration on how to check pulse in carotid and radial arteries.;Review the importance of being able to check your own pulse for safety during independent exercise       Expected Outcomes Short Term: Able to explain why pulse checking is important during independent exercise;Long Term: Able to check pulse independently and accurately       Understanding of Exercise Prescription Yes       Intervention Provide education, explanation, and written materials on patient's individual exercise prescription       Expected Outcomes Short Term: Able to explain program exercise prescription;Long Term: Able to explain home exercise prescription to exercise independently          Exercise Goals Re-Evaluation :     Exercise Goals Re-Evaluation    Row Name 12/20/16 1034 12/27/16 1115 12/27/16 1237 01/10/17 1238       Exercise Goal Re-Evaluation   Exercise Goals Review Increase Physical Activity;Increase Strength and Stamina;Able to understand and use rate of perceived exertion (RPE) scale;Knowledge and understanding of Target Heart Rate Range (THRR) Increase Physical Activity;Increase Strength and Stamina;Knowledge and understanding of Target Heart Rate Range (THRR);Able to understand and use rate of perceived exertion (RPE) scale;Able to check pulse independently;Understanding of Exercise Prescription;Able to understand and use Dyspnea scale Increase Physical Activity;Increase Strength and Stamina;Able to understand and use rate of perceived exertion (RPE) scale;Knowledge and understanding of Target Heart Rate Range (THRR);Able to check pulse independently;Able to understand and use Dyspnea scale;Understanding of Exercise Prescription Increase Physical Activity;Increase Strength and Stamina;Able to understand and use rate of perceived exertion  (RPE) scale;Able to understand and use Dyspnea scale    Comments First full day of exercise!  Patient was oriented to gym and equipment including functions, settings, policies, and procedures.  Patient's individual exercise prescription and treatment plan were reviewed.  All starting workloads were established based on the results of the 6 minute walk test done at initial orientation visit.  The plan for exercise progression was also introduced and progression will be customized based on patient's performance and goals. Reviewed home exercise with pt today.  Pt plans to walk everyday when he is not at  [facility] for exercise.  Reviewed THR, pulse, RPE, sign and symptoms, NTG use, and when to call 911 or MD.  Also discussed weather considerations and indoor options.  Pt voiced understanding. Home exercise reviewed with pt.  HR, RPE O2 were reviewed.  Pt voiced understanding. Clair Gulling is progressing well with exercise.  He has increased overall MET level and the weights he uses for strength work.      Expected Outcomes Short - [place] will attend exercise and education regularly.  Long - [place] will improve overall fitness level. Short: add extra days of exercise at home. Long: Maintain a routine of exercise post [facility]. Short - Clair Gulling will incorporate warm up and cool down and strength work into his exercise at home.  Long - Clair Gulling will continue to maintain these components of exercise. Short - Clair Gulling will continue to attend regularly.  Long - Clair Gulling will continue to increase overall fitness.       Discharge Exercise Prescription (Final Exercise Prescription Changes):     Exercise Prescription Changes - 01/10/17 1200      Response to Exercise   Blood Pressure (Admit) 128/72   Blood Pressure (Exit) 120/68   Heart Rate (Admit) 60 bpm   Heart Rate (Exercise) 110 bpm   Heart Rate (Exit) 66 bpm   Oxygen Saturation (Admit) 96 %   Oxygen Saturation (Exercise) 91 %   Oxygen Saturation (Exit) 93 %   Rating of Perceived  Exertion (Exercise) 13   Perceived Dyspnea (Exercise) 2   Symptoms none   Duration Continue with 45 min of aerobic exercise without signs/symptoms of physical distress.   Intensity THRR unchanged     Progression   Progression Continue to progress workloads to maintain intensity without signs/symptoms of physical distress.   Average METs 4.17     Resistance Training   Training Prescription Yes   Weight 7 lb   Reps 10-15     Oxygen   Oxygen Continuous   Liters 2  Tm only     Treadmill   MPH 2.8   Grade 1.5   Minutes 15   METs 3.72     NuStep   Level 4   SPM 90   Minutes 15   METs 3.2     REL-XR   Level 5   Speed 68   Minutes 15   METs 5.6      Nutrition:  Target Goals: Understanding of nutrition guidelines, daily intake of sodium <1554m, cholesterol <2027m calories 30% from fat and 7% or less from saturated fats, daily to have 5 or more servings of fruits and vegetables.  Biometrics:     Pre Biometrics - 12/12/16 1616      Pre Biometrics   Height 5' 7.9" (1.725 m)   Weight 157 lb 11.2 oz (71.5 kg)   Waist Circumference 38 inches   Hip Circumference 38.25 inches   Waist to Hip Ratio 0.99 %   BMI (Calculated) 24.04       Nutrition Therapy Plan and Nutrition Goals:     Nutrition Therapy & Goals - 01/12/17 1429      Nutrition Therapy   RD appointment defered Yes      Nutrition Discharge: Rate Your Plate Scores:     Nutrition Assessments - 12/12/16 1546      MEDFICTS Scores   Pre Score 70      Nutrition Goals Re-Evaluation:     Nutrition Goals Re-Evaluation    Row Name 01/12/17 1429  Goals   Current Weight 160 lb (72.6 kg)       Nutrition Goal Maintain weight and gain strength.       Comment Dirk has been eating well. He states he tries not to over eat. [place] he is happy with his weight.       Expected Outcome Short: continue to eat healthy. Long: Adhere to a prescribed diet plan.          Nutrition Goals  Discharge (Final Nutrition Goals Re-Evaluation):     Nutrition Goals Re-Evaluation - 01/12/17 1429      Goals   Current Weight 160 lb (72.6 kg)   Nutrition Goal Maintain weight and gain strength.   Comment Saron has been eating well. He states he tries not to over eat. [place] he is happy with his weight.   Expected Outcome Short: continue to eat healthy. Long: Adhere to a prescribed diet plan.      Psychosocial: Target Goals: Acknowledge presence or absence of significant depression and/or stress, maximize coping skills, provide positive support system. Participant is able to verbalize types and ability to use techniques and skills needed for reducing stress and depression.   Initial Review & Psychosocial Screening:     Initial Psych Review & Screening - 12/12/16 1505      Initial Review   Current issues with Current Depression;Current Sleep Concerns;Current Stress Concerns   Source of Stress Concerns Chronic Illness;Unable to perform yard/household activities     [place]? Yes     Barriers   Psychosocial barriers to participate in program The patient should benefit from training in stress management and relaxation.     Screening Interventions   Interventions Yes;Encouraged to exercise;To provide support and resources with identified psychosocial needs   Expected Outcomes Short Term goal: Utilizing psychosocial counselor, staff and physician to assist with identification of specific Stressors or current issues interfering with healing process. Setting desired goal for each stressor or current issue identified.;Long Term Goal: Stressors or current issues are controlled or eliminated.;Short Term goal: Identification and review with participant of any Quality of Life or Depression concerns found by scoring the questionnaire.;Long Term goal: The participant improves quality of Life and PHQ9 Scores as seen by post scores and/or verbalization of changes       Quality of Life Scores:   PHQ-9: Recent Review Flowsheet Data    Depression screen [facility] 2/9 01/08/2017 12/12/2016   Decreased Interest 0 2   Down, Depressed, Hopeless 1 2   PHQ - 2 Score 1 4   Altered sleeping 1 2   Tired, decreased energy 3 2   Change in appetite 0 0   Feeling bad or failure about yourself  2 2   Trouble concentrating 3 3   Moving slowly or fidgety/restless 2 3   Suicidal thoughts 0 0   PHQ-9 Score 12 16   Difficult doing work/chores Very difficult Somewhat difficult     Interpretation of Total Score  Total Score Depression Severity:  1-4 = Minimal depression, 5-9 = Mild depression, 10-14 = Moderate depression, 15-19 = Moderately severe depression, 20-27 = Severe depression   Psychosocial Evaluation and Intervention:     Psychosocial Evaluation - 01/03/17 1122      Psychosocial Evaluation & Interventions   Comments Follow up with Clair Gulling today by Counselor.  He reports the OTC natural sleep aids he has tried have improved his sleep to approx. 6 hours per night and this is a  significant improvement.  He reports his energy level has increased and his anxiety has decreased as a result.  Clair Gulling continues to report being out of breath when walking for any period of time.  Counselor encouraged pacing himself in order to conserve his energy.  Counselor commended Clair Gulling on progress made in such a short time.  Staff will continue to follow with him.     Expected Outcomes Clair Gulling will continue to exercise consistently.  He will also begin to pace himself better when walking to conserve his energy and not be so out of breath upon arrival.  Staff will follow.     Continue Psychosocial Services  Follow up required by staff      Psychosocial Re-Evaluation:     Psychosocial Re-Evaluation    Mission Name 01/15/17 1730             Psychosocial Re-Evaluation   Current issues with Current Depression;Current Stress Concerns       Comments Haydan has been improving since the start of  [facility]. He is inspiring to exercise with a group at work to help with stress and depression. He gets short of breath with incline but has been improving his workloads which make him feel better.        Expected Outcomes Short: Attened the relaxation education. Long: Maintain activities that can minimize stress.       Interventions Encouraged to attend Pulmonary Rehabilitation for the exercise;Stress management education;Relaxation education       Continue Psychosocial Services  Follow up required by staff          Psychosocial Discharge (Final Psychosocial Re-Evaluation):     Psychosocial Re-Evaluation - 01/15/17 1730      Psychosocial Re-Evaluation   Current issues with Current Depression;Current Stress Concerns   Comments Kavari has been improving since the start of [facility]. He is inspiring to exercise with a group at work to help with stress and depression. He gets short of breath with incline but has been improving his workloads which make him feel better.    Expected Outcomes Short: Attened the relaxation education. Long: Maintain activities that can minimize stress.   Interventions Encouraged to attend Pulmonary Rehabilitation for the exercise;Stress management education;Relaxation education   Continue Psychosocial Services  Follow up required by staff      Education: Education Goals: Education classes will be provided on a weekly basis, covering required topics. Participant will state understanding/return demonstration of topics presented.  Learning Barriers/Preferences:     Learning Barriers/Preferences - 12/12/16 1518      Learning Barriers/Preferences   Learning Barriers Sight   Learning Preferences Skilled Demonstration      Education Topics: Initial Evaluation Education: - Verbal, written and demonstration of respiratory meds, RPE/PD scales, oximetry and breathing techniques. Instruction on use of nebulizers and MDIs: cleaning and proper use, rinsing mouth with  steroid doses and importance of monitoring MDI activations.   Pulmonary Rehab from 01/17/2017 in [facility] Cardiac and Pulmonary Rehab  Date  12/20/16  Educator  [facility]  Instruction Review Code  1- Verbalizes Understanding      General Nutrition Guidelines/Fats and Fiber: -Group instruction provided by verbal, written material, models and posters to present the general guidelines for heart healthy nutrition. Gives an explanation and review of dietary fats and fiber.   Pulmonary Rehab from 01/17/2017 in [facility] Cardiac and Pulmonary Rehab  Date  01/15/17  Educator  CR  Instruction Review Code  1- Verbalizes Understanding      Controlling Sodium/Reading  Food Labels: -Group verbal and written material supporting the discussion of sodium use in heart healthy nutrition. Review and explanation with models, verbal and written materials for utilization of the food label.   Exercise Physiology & Risk Factors: - Group verbal and written instruction with models to review the exercise physiology of the cardiovascular system and associated critical values. Details cardiovascular disease risk factors and the goals associated with each risk factor.   Pulmonary Rehab from 01/17/2017 in [facility] Cardiac and Pulmonary Rehab  Date  01/05/17  Educator  Nada Maclachlan, EP  Instruction Review Code  1- Verbalizes Understanding      Aerobic Exercise & Resistance Training: - Gives group verbal and written discussion on the health impact of inactivity. On the components of aerobic and resistive training programs and the benefits of this training and how to safely progress through these programs.   Flexibility, Balance, General Exercise Guidelines: - Provides group verbal and written instruction on the benefits of flexibility and balance training programs. Provides general exercise guidelines with specific guidelines to those with heart or lung disease. Demonstration and skill practice provided.   Stress Management: -  Provides group verbal and written instruction about the health risks of elevated stress, cause of high stress, and healthy ways to reduce stress.   Pulmonary Rehab from 01/17/2017 in [facility] Cardiac and Pulmonary Rehab  Date  12/20/16  Educator  [facility]  Instruction Review Code  1- Verbalizes Understanding      Depression: - Provides group verbal and written instruction on the correlation between heart/lung disease and depressed mood, treatment options, and the stigmas associated with seeking treatment.   Exercise & Equipment Safety: - Individual verbal instruction and demonstration of equipment use and safety with use of the equipment.   Pulmonary Rehab from 01/17/2017 in [facility] Cardiac and Pulmonary Rehab  Date  12/12/16  Educator  [facility]  Instruction Review Code  1- Verbalizes Understanding      Infection Prevention: - Provides verbal and written material to individual with discussion of infection control including proper hand washing and proper equipment cleaning during exercise session.   Pulmonary Rehab from 01/17/2017 in [facility] Cardiac and Pulmonary Rehab  Date  12/20/16  Educator  [facility]  Instruction Review Code  1- Verbalizes Understanding      Falls Prevention: - Provides verbal and written material to individual with discussion of falls prevention and safety.   Pulmonary Rehab from 01/17/2017 in [facility] Cardiac and Pulmonary Rehab  Date  12/20/16  Educator  [facility]  Instruction Review Code  1- Verbalizes Understanding      Diabetes: - Individual verbal and written instruction to review signs/symptoms of diabetes, desired ranges of glucose level fasting, after meals and with exercise. Advice that pre and post exercise glucose checks will be done for 3 sessions at entry of program.   Chronic Lung Diseases: - Group verbal and written instruction to review new updates, new respiratory medications, new advancements in procedures and treatments. Provide informative websites and "800" numbers of  self-education.   Pulmonary Rehab from 01/17/2017 in [facility] Cardiac and Pulmonary Rehab  Date  01/03/17  Educator  [facility]  Instruction Review Code  1- Verbalizes Understanding      Lung Procedures: - Group verbal and written instruction to describe testing methods done to diagnose lung disease. Review the outcome of test results. Describe the treatment choices: Pulmonary Function Tests, ABGs and oximetry.   Energy Conservation: - Provide group verbal and written instruction for methods to conserve energy, plan  and organize activities. Instruct on pacing techniques, use of adaptive equipment and posture/positioning to relieve shortness of breath.   Triggers: - Group verbal and written instruction to review types of environmental controls: home humidity, furnaces, filters, dust mite/pet prevention, HEPA vacuums. To discuss weather changes, air quality and the benefits of nasal washing.   Exacerbations: - Group verbal and written instruction to provide: warning signs, infection symptoms, calling MD promptly, preventive modes, and value of vaccinations. Review: effective airway clearance, coughing and/or vibration techniques. Create an Sports administrator.   Oxygen: - Individual and group verbal and written instruction on oxygen therapy. Includes supplement oxygen, available portable oxygen systems, continuous and intermittent flow rates, oxygen safety, concentrators, and Medicare reimbursement for oxygen.   Pulmonary Rehab from 01/17/2017 in [facility] Cardiac and Pulmonary Rehab  Date  12/20/16  Educator  [facility]  Instruction Review Code  1- Verbalizes Understanding      Respiratory Medications: - Group verbal and written instruction to review medications for lung disease. Drug class, frequency, complications, importance of spacers, rinsing mouth after steroid MDI's, and proper cleaning methods for nebulizers.   Pulmonary Rehab from 01/17/2017 in [facility] Cardiac and Pulmonary Rehab  Date  12/12/16  Educator   [facility]  Instruction Review Code  1- Verbalizes Understanding      AED/CPR: - Group verbal and written instruction with the use of models to demonstrate the basic use of the AED with the basic ABC's of resuscitation.   Pulmonary Rehab from 01/17/2017 in [facility] Cardiac and Pulmonary Rehab  Date  12/22/16  Educator  CE  Instruction Review Code  1- Verbalizes Understanding      Breathing Retraining: - Provides individuals verbal and written instruction on purpose, frequency, and proper technique of diaphragmatic breathing and pursed-lipped breathing. Applies individual practice skills.   Pulmonary Rehab from 01/17/2017 in [facility] Cardiac and Pulmonary Rehab  Date  12/20/16  Educator  [facility]  Instruction Review Code  1- Verbalizes Understanding      Anatomy and Physiology of the Lungs: - Group verbal and written instruction with the use of models to provide basic lung anatomy and physiology related to function, structure and complications of lung disease.   Anatomy & Physiology of the Heart: - Group verbal and written instruction and models provide basic cardiac anatomy and physiology, with the coronary electrical and arterial systems. Review of: AMI, Angina, Valve disease, Heart Failure, Cardiac Arrhythmia, Pacemakers, and the ICD.   Heart Failure: - Group verbal and written instruction on the basics of heart failure: signs/symptoms, treatments, explanation of ejection fraction, enlarged heart and cardiomyopathy.   Sleep Apnea: - Individual verbal and written instruction to review Obstructive Sleep Apnea. Review of risk factors, methods for diagnosing and types of masks and machines for OSA.   Anxiety: - Provides group, verbal and written instruction on the correlation between heart/lung disease and anxiety, treatment options, and management of anxiety.   Relaxation: - Provides group, verbal and written instruction about the benefits of relaxation for patients with heart/lung disease. Also  provides patients with examples of relaxation techniques.   Pulmonary Rehab from 01/17/2017 in [facility] Cardiac and Pulmonary Rehab  Date  01/17/17  Educator  [facility]  Instruction Review Code  1- Verbalizes Understanding      Cardiac Medications: - Group verbal and written instruction to review commonly prescribed medications for heart disease. Reviews the medication, class of the drug, and side effects.   Know Your Numbers: -Group verbal and written instruction about important numbers in your health.  Review of Cholesterol, Blood Pressure, Diabetes, and BMI and the role they play in your overall health.   Pulmonary Rehab from 01/17/2017 in [facility] Cardiac and Pulmonary Rehab  Date  01/12/17  Educator  [facility]  Instruction Review Code  1- Verbalizes Understanding      Other: -Provides group and verbal instruction on various topics (see comments)    Knowledge Questionnaire Score:     Knowledge Questionnaire Score - 12/12/16 1453      Knowledge Questionnaire Score   Pre Score 8/10  Reviewed with patient       Core Components/Risk Factors/Patient Goals at Admission:     Personal Goals and Risk Factors at Admission - 12/12/16 1414      Core Components/Risk Factors/Patient Goals on Admission    Weight Management Weight Maintenance;Yes   Intervention Weight Management: Develop a combined nutrition and exercise program designed to reach desired caloric intake, while maintaining appropriate intake of nutrient and fiber, sodium and fats, and appropriate energy expenditure required for the weight goal.;Weight Management: Provide education and appropriate resources to help participant work on and attain dietary goals.   Admit Weight 157 lb 11.2 oz (71.5 kg)   Goal Weight: Short Term 157 lb (71.2 kg)   Goal Weight: Long Term 155 lb (70.3 kg)   Expected Outcomes Short Term: Continue to assess and modify interventions until short term weight is achieved;Long Term: Adherence to nutrition and physical  activity/exercise program aimed toward attainment of established weight goal;Weight Maintenance: Understanding of the daily nutrition guidelines, which includes 25-35% calories from fat, 7% or less cal from saturated fats, less than 213m cholesterol, less than 1.5gm of sodium, & 5 or more servings of fruits and vegetables daily   Improve shortness of breath with ADL's Yes   Intervention Provide education, individualized exercise plan and daily activity instruction to help decrease symptoms of SOB with activities of daily living.   Expected Outcomes Short Term: Achieves a reduction of symptoms when performing activities of daily living.   Stress Yes   Intervention Offer individual and/or small group education and counseling on adjustment to heart disease, stress management and health-related lifestyle change. Teach and support self-help strategies.;Refer participants experiencing significant psychosocial distress to appropriate mental health specialists for further evaluation and treatment. When possible, include family members and significant others in education/counseling sessions.   Expected Outcomes Short Term: Participant demonstrates changes in health-related behavior, relaxation and other stress management skills, ability to obtain effective social support, and compliance with psychotropic medications if prescribed.;Long Term: Emotional wellbeing is indicated by absence of clinically significant psychosocial distress or social isolation.      Core Components/Risk Factors/Patient Goals Review:    Core Components/Risk Factors/Patient Goals at Discharge (Final Review):    ITP Comments:     ITP Comments    Row Name 12/12/16 1411 12/25/16 0821 01/12/17 1104 01/22/17 0816     ITP Comments Medical Evaluation completed. Chart sent for review and changes to Dr. MEmily Filbertdirector of [place] 30 day review completed. ITP sent to Dr. MEmily FilbertDirector of [facility] Continue with ITP unless  changes are made by physician.   JRupertinsurance is going to change November 1st and will not be able to afford the program. He is going to attend [facility] until that time. 30 day review completed. ITP sent to Dr. MEmily FilbertDirector of [place] Continue with ITP unless changes are made by physician.         Comments: 30 day review

## 2017-01-24 DIAGNOSIS — J449 Chronic obstructive pulmonary disease, unspecified: Secondary | ICD-10-CM | POA: Diagnosis not present

## 2017-01-24 NOTE — Progress Notes (Signed)
Daily Session Note  Patient Details  Name: Bryan Montoya MRN: [id number] Date of Birth: 1956-01-28 Referring Provider:     Pulmonary Rehab from 12/12/2016 in [facility] Cardiac and Pulmonary Rehab  Referring Provider  Ancil Linsey MD      Encounter Date: 01/24/2017  Check In:     Session Check In - 01/24/17 1002      Check-In   Location [facility]-Cardiac & Pulmonary Rehab   Staff Present Alberteen Sam, MA, ACSM RCEP, Exercise Physiologist;Amanda Oletta Darter, BA, ACSM CEP, Exercise Physiologist;Davonte Siebenaler Flavia Shipper   Supervising physician immediately available to respond to emergencies [facility] immediately available ER MD   Physician(s) Drs. Willimas and Schaevitz   Medication changes reported     No   Fall or balance concerns reported    No   Warm-up and Cool-down Performed as group-led Location manager Performed Yes   VAD Patient? No     Pain Assessment   Currently in Pain? No/denies         History  Smoking Status  . Former Smoker  . Packs/day: 1.00  . Years: 30.00  . Types: Cigarettes  . Quit date: 01/01/2006  Smokeless Tobacco  . Never Used    Comment: patient does not smoke    Goals Met:  Proper associated with RPD/PD & O2 Sat Independence with exercise equipment Using PLB without cueing & demonstrates good technique Exercise tolerated well No report of cardiac concerns or symptoms Strength training completed today  Goals Unmet:  Not Applicable  Comments: Pt able to follow exercise prescription today without complaint.  Will continue to monitor for progression.    Dr. Emily Filbert is Medical Director for [place] and [facility] Pulmonary Rehabilitation.

## 2017-01-26 VITALS — Ht 67.9 in | Wt 159.0 lb

## 2017-01-26 DIAGNOSIS — J449 Chronic obstructive pulmonary disease, unspecified: Secondary | ICD-10-CM | POA: Diagnosis not present

## 2017-01-26 NOTE — Progress Notes (Signed)
Daily Session Note  Patient Details  Name: Bryan Montoya MRN: [id number] Date of Birth: [birth year] Referring Provider:     Pulmonary Rehab from 12/12/2016 in [facility] Cardiac and Pulmonary Rehab  Referring Provider  Ancil Linsey MD      Encounter Date: 01/26/2017  Check In:     Session Check In - 01/26/17 0955      Check-In   Location [facility]-Cardiac & Pulmonary Rehab   Staff Present Renita Papa, RN BSN;Xayvier Vallez [place]oy [place], [place], ACSM CEP, Exercise Physiologist   Supervising physician immediately available to respond to emergencies [facility] immediately available ER MD   Physician(s) Dr. Alfred Levins and Rifenbark   Medication changes reported     No   Fall or balance concerns reported    No   Warm-up and Cool-down Performed as group-led instruction   Resistance Training Performed Yes   VAD Patient? No     Pain Assessment   Currently in Pain? No/denies         History  Smoking Status  . Former Smoker  . Packs/day: 1.00  . Years: 30.00  . Types: Cigarettes  . Quit date: 01/01/2006  Smokeless Tobacco  . Never Used    Comment: patient does not smoke    Goals Met:  Proper associated with RPD/PD & O2 Sat Independence with exercise equipment Exercise tolerated well No report of cardiac concerns or symptoms Strength training completed today  Goals Unmet:  Not Applicable  Comments:      6 Minute Walk    Row Name 12/12/16 1610 01/26/17 1043       6 Minute Walk   Phase Initial Discharge    Distance 1420 feet  wearing back brace 1600 feet    Distance % Change  - 12.6 %    Distance Feet Change  - 180 ft    Walk Time 6 minutes 6 minutes    # of Rest Breaks 0 0    MPH 2.69 3.03    METS 3.34 4.27    RPE 13 13    Perceived Dyspnea  2 2    VO2 Peak 11.7 14.96    Symptoms No No    Resting HR 51 bpm 68 bpm    Resting BP 128/64 124/62    Resting Oxygen Saturation  98 % 95 %    Exercise Oxygen Saturation  during 6 min walk 84 % 90 %    Max Ex.  HR 61 bpm 111 bpm    Max Ex. BP 134/74 150/74    2 Minute Post BP 122/70  -      Interval HR   1 Minute HR 58 81    2 Minute HR 61 85    3 Minute HR  - 111    4 Minute HR 58 105    5 Minute HR 58 103    6 Minute HR 61 97    2 Minute Post HR 53 79    Interval Heart Rate? Yes Yes      Interval Oxygen   Interval Oxygen? Yes Yes    Baseline Oxygen Saturation % 98 % 95 %    1 Minute Oxygen Saturation % 96 % 96 %    1 Minute Liters of Oxygen 2 L  pulsed 2 L    2 Minute Oxygen Saturation % 94 % 94 %    2 Minute Liters of Oxygen 2 L 2 L    3 Minute Oxygen Saturation % 89 % 90 %  3 Minute Liters of Oxygen 2 L 2 L    4 Minute Oxygen Saturation % 91 % 90 %    4 Minute Liters of Oxygen 2 L 2 L    5 Minute Oxygen Saturation % 92 % 90 %    5 Minute Liters of Oxygen 2 L 2 L    6 Minute Oxygen Saturation % 84 % 90 %    6 Minute Liters of Oxygen 2 L 2 L    2 Minute Post Oxygen Saturation % 100 % 95 %    2 Minute Post Liters of Oxygen 2 L 2 L     Pt able to follow exercise prescription today without complaint.  Will continue to monitor for progression.   Dr. Emily Filbert is Medical Director for [place] and [facility] Pulmonary Rehabilitation.

## 2017-01-29 DIAGNOSIS — J449 Chronic obstructive pulmonary disease, unspecified: Secondary | ICD-10-CM | POA: Diagnosis not present

## 2017-01-29 NOTE — Progress Notes (Addendum)
Pulmonary Individual Treatment Plan  Patient Details  Name: JAHLIL ZILLER MRN: [id number] Date of Birth: Apr 16, 1955 Referring Provider:     Pulmonary Rehab from 12/12/2016 in [facility] Cardiac and Pulmonary Rehab  Referring Provider  Ancil Linsey MD      Initial Encounter Date:    Pulmonary Rehab from 12/12/2016 in [facility] Cardiac and Pulmonary Rehab  Date  12/12/16  Referring Provider  Ancil Linsey MD      Visit Diagnosis: Chronic obstructive pulmonary disease, unspecified COPD type ([place])  Patient's Home Medications on Admission:  Current Outpatient Prescriptions:  .  albuterol (PROVENTIL HFA;VENTOLIN HFA) 108 (90 Base) MCG/ACT inhaler, Inhale into the lungs., Disp: , Rfl:  .  fluticasone (FLONASE) 50 MCG/ACT nasal spray, Place into the nose., Disp: , Rfl:  .  fluticasone furoate-vilanterol (BREO ELLIPTA) 100-25 MCG/INH AEPB, Inhale into the lungs., Disp: , Rfl:  .  gabapentin (NEURONTIN) 300 MG capsule, Take 1-3 tablets nightly prn restless leg, Disp: , Rfl:  .  loratadine (CLARITIN) 10 MG tablet, Take by mouth., Disp: , Rfl:  .  Melatonin 5 MG TABS, 10 mg. Take 1 tablet by mouth at bedtime as needed, Disp: , Rfl:  .  meloxicam (MOBIC) 7.5 MG tablet, Take by mouth., Disp: , Rfl:  .  Respiratory Therapy Supplies (FLUTTER) DEVI, Use 1 each 3 (three) times daily., Disp: , Rfl:  .  umeclidinium bromide (INCRUSE ELLIPTA) 62.5 MCG/INH AEPB, Inhale into the lungs., Disp: , Rfl:   Past Medical History: No past medical history on file.  Tobacco Use: History  Smoking Status  . Former Smoker  . Packs/day: 1.00  . Years: 30.00  . Types: Cigarettes  . Quit date: 01/01/2006  Smokeless Tobacco  . Never Used    Comment: patient does not smoke    Labs: Recent Review Flowsheet Data    There is no flowsheet data to display.       Pulmonary Assessment Scores:     Pulmonary Assessment Scores    Row Name 12/12/16 1459 01/24/17 1028       ADL UCSD   ADL Phase Entry Exit    SOB  Score total 44 56    Rest 0 0    Walk 1 0    Stairs 5 4    Bath 2 2    Dress 1 2    Shop 1 3      CAT Score   CAT Score 22 19      mMRC Score   mMRC Score 2  -       Pulmonary Function Assessment:     Pulmonary Function Assessment - 12/12/16 1513      Initial Spirometry Results   FVC% 71 %   FEV1% 49 %   FEV1/FVC Ratio 52.34     Post Bronchodilator Spirometry Results   FVC% 66.03 %   FEV1% 47.04 %   FEV1/FVC Ratio 54     Breath   Bilateral Breath Sounds Clear;Decreased   Shortness of Breath Limiting activity;Yes      Exercise Target Goals:    Exercise Program Goal: Individual exercise prescription set with THRR, safety & activity barriers. Participant demonstrates ability to understand and report RPE using BORG scale, to self-measure pulse accurately, and to acknowledge the importance of the exercise prescription.  Exercise Prescription Goal: Starting with aerobic activity 30 plus minutes a day, 3 days per week for initial exercise prescription. Provide home exercise prescription and guidelines that participant acknowledges understanding prior to discharge.  Activity Barriers & Risk Stratification:     Activity Barriers & Cardiac Risk Stratification - 12/12/16 1612      Activity Barriers & Cardiac Risk Stratification   Activity Barriers Back Problems;Neck/Spine Problems;Shortness of Breath;Muscular Weakness;Deconditioning  back surgery 4 months ago (fusion L4-S1)      6 Minute Walk:     6 Minute Walk    Row Name 12/12/16 1610 01/26/17 1043       6 Minute Walk   Phase Initial Discharge    Distance 1420 feet  wearing back brace 1600 feet    Distance % Change  - 12.6 %    Distance Feet Change  - 180 ft    Walk Time 6 minutes 6 minutes    # of Rest Breaks 0 0    MPH 2.69 3.03    METS 3.34 4.27    RPE 13 13    Perceived Dyspnea  2 2    VO2 Peak 11.7 14.96    Symptoms No No    Resting HR 51 bpm 68 bpm    Resting BP 128/64 124/62    Resting  Oxygen Saturation  98 % 95 %    Exercise Oxygen Saturation  during 6 min walk 84 % 90 %    Max Ex. HR 61 bpm 111 bpm    Max Ex. BP 134/74 150/74    2 Minute Post BP 122/70  -      Interval HR   1 Minute HR 58 81    2 Minute HR 61 85    3 Minute HR  - 111    4 Minute HR 58 105    5 Minute HR 58 103    6 Minute HR 61 97    2 Minute Post HR 53 79    Interval Heart Rate? Yes Yes      Interval Oxygen   Interval Oxygen? Yes Yes    Baseline Oxygen Saturation % 98 % 95 %    1 Minute Oxygen Saturation % 96 % 96 %    1 Minute Liters of Oxygen 2 L  pulsed 2 L    2 Minute Oxygen Saturation % 94 % 94 %    2 Minute Liters of Oxygen 2 L 2 L    3 Minute Oxygen Saturation % 89 % 90 %    3 Minute Liters of Oxygen 2 L 2 L    4 Minute Oxygen Saturation % 91 % 90 %    4 Minute Liters of Oxygen 2 L 2 L    5 Minute Oxygen Saturation % 92 % 90 %    5 Minute Liters of Oxygen 2 L 2 L    6 Minute Oxygen Saturation % 84 % 90 %    6 Minute Liters of Oxygen 2 L 2 L    2 Minute Post Oxygen Saturation % 100 % 95 %    2 Minute Post Liters of Oxygen 2 L 2 L      Oxygen Initial Assessment:     Oxygen Initial Assessment - 12/12/16 1520      Home Oxygen   Home Oxygen Device Portable Concentrator;Home Concentrator;E-Tanks   Sleep Oxygen Prescription Continuous   Liters per minute 2   Home Exercise Oxygen Prescription Continuous   Liters per minute 2   Home at Rest Exercise Oxygen Prescription None   Compliance with Home Oxygen Use Yes     Initial 6 min Walk   Oxygen Used  Pulsed   Liters per minute 2     Program Oxygen Prescription   Program Oxygen Prescription Continuous   Liters per minute 2     Intervention   Short Term Goals To learn and exhibit compliance with exercise, home and travel O2 prescription;To learn and understand importance of maintaining oxygen saturations>88%;To learn and demonstrate proper use of respiratory medications;To learn and demonstrate proper pursed lip breathing  techniques or other breathing techniques.;To learn and understand importance of monitoring SPO2 with pulse oximeter and demonstrate accurate use of the pulse oximeter.   Long  Term Goals Exhibits compliance with exercise, home and travel O2 prescription;Maintenance of O2 saturations>88%;Compliance with respiratory medication;Demonstrates proper use of MDI's;Exhibits proper breathing techniques, such as pursed lip breathing or other method taught during program session;Verbalizes importance of monitoring SPO2 with pulse oximeter and return demonstration      Oxygen Re-Evaluation:     Oxygen Re-Evaluation    Row Name 01/10/17 1047             Program Oxygen Prescription   Program Oxygen Prescription Continuous       Liters per minute 2         Home Oxygen   Home Oxygen Device Portable Concentrator;Home Concentrator;E-Tanks       Sleep Oxygen Prescription Continuous       Liters per minute 2       Home Exercise Oxygen Prescription Continuous       Liters per minute 2       Home at Rest Exercise Oxygen Prescription None       Compliance with Home Oxygen Use Yes         Goals/Expected Outcomes   Short Term Goals To learn and exhibit compliance with exercise, home and travel O2 prescription;To learn and understand importance of maintaining oxygen saturations>88%;To learn and demonstrate proper pursed lip breathing techniques or other breathing techniques.;To learn and understand importance of monitoring SPO2 with pulse oximeter and demonstrate accurate use of the pulse oximeter.;To learn and demonstrate proper use of respiratory medications       Long  Term Goals Exhibits compliance with exercise, home and travel O2 prescription;Maintenance of O2 saturations>88%;Compliance with respiratory medication;Demonstrates proper use of MDI's;Exhibits proper breathing techniques, such as pursed lip breathing or other method taught during program session;Verbalizes importance of monitoring SPO2 with  pulse oximeter and return demonstration       Comments Clair Gulling is taking is Breo as prescribed and using his spacer with his albuterol inhaler when he needs it. He takes his albuterol inhaler about twice a week. Informed him to work on PLB when he feels short of breath. He has a pulse oximter to check his oxygen levels and verbalizes understanding to keep his oxygen staturation above 88 percent.       Goals/Expected Outcomes Short: Become more profiecient at using PLB.   Long: Become independent at using PLB.          Oxygen Discharge (Final Oxygen Re-Evaluation):     Oxygen Re-Evaluation - 01/10/17 1047      Program Oxygen Prescription   Program Oxygen Prescription Continuous   Liters per minute 2     Home Oxygen   Home Oxygen Device Portable Concentrator;Home Concentrator;E-Tanks   Sleep Oxygen Prescription Continuous   Liters per minute 2   Home Exercise Oxygen Prescription Continuous   Liters per minute 2   Home at Rest Exercise Oxygen Prescription None   Compliance with Home Oxygen Use Yes  Goals/Expected Outcomes   Short Term Goals To learn and exhibit compliance with exercise, home and travel O2 prescription;To learn and understand importance of maintaining oxygen saturations>88%;To learn and demonstrate proper pursed lip breathing techniques or other breathing techniques.;To learn and understand importance of monitoring SPO2 with pulse oximeter and demonstrate accurate use of the pulse oximeter.;To learn and demonstrate proper use of respiratory medications   Long  Term Goals Exhibits compliance with exercise, home and travel O2 prescription;Maintenance of O2 saturations>88%;Compliance with respiratory medication;Demonstrates proper use of MDI's;Exhibits proper breathing techniques, such as pursed lip breathing or other method taught during program session;Verbalizes importance of monitoring SPO2 with pulse oximeter and return demonstration   Comments Clair Gulling is taking is Breo as  prescribed and using his spacer with his albuterol inhaler when he needs it. He takes his albuterol inhaler about twice a week. Informed him to work on PLB when he feels short of breath. He has a pulse oximter to check his oxygen levels and verbalizes understanding to keep his oxygen staturation above 88 percent.   Goals/Expected Outcomes Short: Become more profiecient at using PLB.   Long: Become independent at using PLB.      Initial Exercise Prescription:     Initial Exercise Prescription - 12/12/16 1600      Date of Initial Exercise RX and Referring Provider   Date 12/12/16   Referring Provider Ancil Linsey MD     Oxygen   Oxygen Continuous   Liters 2     Treadmill   MPH 2.6   Grade 0.5   Minutes 15   METs 3.17     NuStep   Level 3   SPM 80   Minutes 15   METs 3     REL-XR   Level 2   Speed 50   Minutes 15   METs 3     Prescription Details   Frequency (times per week) 3   Duration Progress to 45 minutes of aerobic exercise without signs/symptoms of physical distress     Intensity   THRR 40-80% of Max Heartrate 93-136   Ratings of Perceived Exertion 11-13   Perceived Dyspnea 0-4     Progression   Progression Continue to progress workloads to maintain intensity without signs/symptoms of physical distress.     Resistance Training   Training Prescription Yes   Weight 3 lbs   Reps 10-15      Perform Capillary Blood Glucose checks as needed.  Exercise Prescription Changes:     Exercise Prescription Changes    Row Name 12/12/16 1600 12/27/16 1100 12/27/16 1200 01/10/17 1200 01/24/17 1200     Response to Exercise   Blood Pressure (Admit) 128/64  - 144/70 128/72 122/64   Blood Pressure (Exercise) 134/74  -  -  -  -   Blood Pressure (Exit) 122/70  - 120/60 120/68 114/62   Heart Rate (Admit) 51 bpm  - 58 bpm 60 bpm 60 bpm   Heart Rate (Exercise) 61 bpm  - 92 bpm 110 bpm 103 bpm   Heart Rate (Exit) 53 bpm  - 61 bpm 66 bpm 58 bpm   Oxygen Saturation  (Admit) 98 %  - 95 % 96 % 97 %   Oxygen Saturation (Exercise) 84 %  - 92 % 91 % 91 %   Oxygen Saturation (Exit) 100 %  - 95 % 93 % 95 %   Rating of Perceived Exertion (Exercise) 13  - 13 13 13    Perceived Dyspnea (Exercise)  2  - 2 2 2    Symptoms none  - none none none   Comments walk test results, wearing back brace  -  -  -  -   Duration  -  - Continue with 45 min of aerobic exercise without signs/symptoms of physical distress. Continue with 45 min of aerobic exercise without signs/symptoms of physical distress. Continue with 45 min of aerobic exercise without signs/symptoms of physical distress.   Intensity  -  - THRR unchanged THRR unchanged THRR unchanged     Progression   Progression  -  -  - Continue to progress workloads to maintain intensity without signs/symptoms of physical distress. Continue to progress workloads to maintain intensity without signs/symptoms of physical distress.   Average METs  -  -  - 4.17 3.87     Resistance Training   Training Prescription  - Yes Yes Yes Yes   Weight  - 3 lbs 3 lbs 7 lb 7 lb   Reps  - 10-15 10-15 10-15 10-15     Oxygen   Oxygen  - Continuous Continuous Continuous Continuous   Liters  - 2 2  TM only 2  Tm only 2  TM only     Treadmill   MPH  - 2.6 2.6 2.8 2.8   Grade  - 0.5 0.5 1.5 2.5   Minutes  - 15 15 15 15    METs  - 3.17 3.17 3.72 4.11     NuStep   Level  - 3 3 4 4    SPM  - 80 71 90 90   Minutes  - 15 15 15 15    METs  - 3 2 3.2 3.1     REL-XR   Level  - 2 3 5 6    Speed  - 50 53 68 55   Minutes  - 15 15 15 15    METs  - 3 3.4 5.6 4.4     Home Exercise Plan   Plans to continue exercise at  - Home (comment)  He is thinking of [place] (comment)  He is thinking of joinin Loss adjuster, chartered  - Add 4 additional days to program exercise sessions.  -  - Add 4 additional days to program exercise sessions.   Initial Home Exercises Provided  - 12/27/16  -  - 12/27/16      Exercise Comments:      Exercise Comments    Row Name 12/27/16 1115           Exercise Comments Reviewed home exercise with pt today.  Pt plans to walk everyday when he is not at [place] for exercise.  Reviewed THR, pulse, RPE, sign and symptoms, NTG use, and when to call 911 or MD.  Also discussed weather considerations and indoor options.  Pt voiced understanding.          Exercise Goals and Review:     Exercise Goals    Row Name 12/12/16 1613             Exercise Goals   Increase Physical Activity Yes       Intervention Provide advice, education, support and counseling about physical activity/exercise needs.;Develop an individualized exercise prescription for aerobic and resistive training based on initial evaluation findings, risk stratification, comorbidities and participant's personal goals.       Expected Outcomes Achievement of increased cardiorespiratory fitness and enhanced flexibility, muscular endurance and strength shown through measurements of  functional capacity and personal statement of participant.       Increase Strength and Stamina Yes       Intervention Provide advice, education, support and counseling about physical activity/exercise needs.;Develop an individualized exercise prescription for aerobic and resistive training based on initial evaluation findings, risk stratification, comorbidities and participant's personal goals.       Expected Outcomes Achievement of increased cardiorespiratory fitness and enhanced flexibility, muscular endurance and strength shown through measurements of functional capacity and personal statement of participant.       Able to understand and use rate of perceived exertion (RPE) scale Yes       Intervention Provide education and explanation on how to use RPE scale       Expected Outcomes Short Term: Able to use RPE daily in rehab to express subjective intensity level;Long Term:  Able to use RPE to guide intensity level when exercising independently        Able to understand and use Dyspnea scale Yes       Intervention Provide education and explanation on how to use Dyspnea scale       Expected Outcomes Short Term: Able to use Dyspnea scale daily in rehab to express subjective sense of shortness of breath during exertion;Long Term: Able to use Dyspnea scale to guide intensity level when exercising independently       Knowledge and understanding of Target Heart Rate Range (THRR) Yes       Intervention Provide education and explanation of THRR including how the numbers were predicted and where they are located for reference       Expected Outcomes Short Term: Able to state/look up THRR;Long Term: Able to use THRR to govern intensity when exercising independently;Short Term: Able to use daily as guideline for intensity in rehab       Able to check pulse independently Yes       Intervention Provide education and demonstration on how to check pulse in carotid and radial arteries.;Review the importance of being able to check your own pulse for safety during independent exercise       Expected Outcomes Short Term: Able to explain why pulse checking is important during independent exercise;Long Term: Able to check pulse independently and accurately       Understanding of Exercise Prescription Yes       Intervention Provide education, explanation, and written materials on patient's individual exercise prescription       Expected Outcomes Short Term: Able to explain program exercise prescription;Long Term: Able to explain home exercise prescription to exercise independently          Exercise Goals Re-Evaluation :     Exercise Goals Re-Evaluation    Row Name 12/20/16 1034 12/27/16 1115 12/27/16 1237 01/10/17 1238 01/24/17 1243     Exercise Goal Re-Evaluation   Exercise Goals Review Increase Physical Activity;Increase Strength and Stamina;Able to understand and use rate of perceived exertion (RPE) scale;Knowledge and understanding of Target Heart Rate Range  (THRR) Increase Physical Activity;Increase Strength and Stamina;Knowledge and understanding of Target Heart Rate Range (THRR);Able to understand and use rate of perceived exertion (RPE) scale;Able to check pulse independently;Understanding of Exercise Prescription;Able to understand and use Dyspnea scale Increase Physical Activity;Increase Strength and Stamina;Able to understand and use rate of perceived exertion (RPE) scale;Knowledge and understanding of Target Heart Rate Range (THRR);Able to check pulse independently;Able to understand and use Dyspnea scale;Understanding of Exercise Prescription Increase Physical Activity;Increase Strength and Stamina;Able to understand and use rate of perceived  exertion (RPE) scale;Able to understand and use Dyspnea scale Increase Physical Activity;Increase Strength and Stamina   Comments First full day of exercise!  Patient was oriented to gym and equipment including functions, settings, policies, and procedures.  Patient's individual exercise prescription and treatment plan were reviewed.  All starting workloads were established based on the results of the 6 minute walk test done at initial orientation visit.  The plan for exercise progression was also introduced and progression will be customized based on patient's performance and goals. Reviewed home exercise with pt today.  Pt plans to walk everyday when he is not at [place] for exercise.  Reviewed THR, pulse, RPE, sign and symptoms, NTG use, and when to call 911 or MD.  Also discussed weather considerations and indoor options.  Pt voiced understanding. Home exercise reviewed with pt.  HR, RPE O2 were reviewed.  Pt voiced understanding. Clair Gulling is progressing well with exercise.  He has increased overall MET level and the weights he uses for strength work.   Clair Gulling has progressed well and increased his incline on the TM.   Expected Outcomes Short - Deone will attend exercise and education regularly.  Long - Krishav will improve  overall fitness level. Short: add extra days of exercise at home. Long: Maintain a routine of exercise post [facility]. Short - Clair Gulling will incorporate warm up and cool down and strength work into his exercise at home.  Long - Clair Gulling will continue to maintain these components of exercise. Short - Clair Gulling will continue to attend regularly.  Long - Clair Gulling will continue to increase overall fitness. Short - Clair Gulling will exercise regularly.  Long - Clair Gulling will continue to improve overall fitness level.      Discharge Exercise Prescription (Final Exercise Prescription Changes):     Exercise Prescription Changes - 01/24/17 1200      Response to Exercise   Blood Pressure (Admit) 122/64   Blood Pressure (Exit) 114/62   Heart Rate (Admit) 60 bpm   Heart Rate (Exercise) 103 bpm   Heart Rate (Exit) 58 bpm   Oxygen Saturation (Admit) 97 %   Oxygen Saturation (Exercise) 91 %   Oxygen Saturation (Exit) 95 %   Rating of Perceived Exertion (Exercise) 13   Perceived Dyspnea (Exercise) 2   Symptoms none   Duration Continue with 45 min of aerobic exercise without signs/symptoms of physical distress.   Intensity THRR unchanged     Progression   Progression Continue to progress workloads to maintain intensity without signs/symptoms of physical distress.   Average METs 3.87     Resistance Training   Training Prescription Yes   Weight 7 lb   Reps 10-15     Oxygen   Oxygen Continuous   Liters 2  TM only     Treadmill   MPH 2.8   Grade 2.5   Minutes 15   METs 4.11     NuStep   Level 4   SPM 90   Minutes 15   METs 3.1     REL-XR   Level 6   Speed 55   Minutes 15   METs 4.4     Home Exercise Plan   Plans to continue exercise at Home (comment)  He is thinking of joinin Financial controller   Frequency Add 4 additional days to program exercise sessions.   Initial Home Exercises Provided 12/27/16      Nutrition:  Target Goals: Understanding of nutrition guidelines, daily intake of sodium <1577m, cholesterol  <2052m calories  30% from fat and 7% or less from saturated fats, daily to have 5 or more servings of fruits and vegetables.  Biometrics:     Pre Biometrics - 12/12/16 1616      Pre Biometrics   Height 5' 7.9" (1.725 m)   Weight 157 lb 11.2 oz (71.5 kg)   Waist Circumference 38 inches   Hip Circumference 38.25 inches   Waist to Hip Ratio 0.99 %   BMI (Calculated) 24.04         Post Biometrics - 01/26/17 1047       Post  Biometrics   Height 5' 7.9" (1.725 m)   Weight 159 lb (72.1 kg)   Waist Circumference 37.5 inches   Hip Circumference 38.25 inches   Waist to Hip Ratio 0.98 %   BMI (Calculated) 24.24      Nutrition Therapy Plan and Nutrition Goals:     Nutrition Therapy & Goals - 01/12/17 1429      Nutrition Therapy   RD appointment defered Yes      Nutrition Discharge: Rate Your Plate Scores:     Nutrition Assessments - 12/12/16 1546      MEDFICTS Scores   Pre Score 70      Nutrition Goals Re-Evaluation:     Nutrition Goals Re-Evaluation    Row Name 01/12/17 1429             Goals   Current Weight 160 lb (72.6 kg)       Nutrition Goal Maintain weight and gain strength.       Comment Green has been eating well. He states he tries not to over eat. [place] he is happy with his weight.       Expected Outcome Short: continue to eat healthy. Long: Adhere to a prescribed diet plan.          Nutrition Goals Discharge (Final Nutrition Goals Re-Evaluation):     Nutrition Goals Re-Evaluation - 01/12/17 1429      Goals   Current Weight 160 lb (72.6 kg)   Nutrition Goal Maintain weight and gain strength.   Comment T[place]on has been eating well. He states he tries not to over eat. [place] he is happy with his weight.   Expected Outcome Short: continue to eat healthy. Long: Adhere to a prescribed diet plan.      Psychosocial: Target Goals: Acknowledge presence or absence of significant depression and/or stress, maximize coping skills, provide  positive support system. Participant is able to verbalize types and ability to use techniques and skills needed for reducing stress and depression.   Initial Review & Psychosocial Screening:     Initial Psych Review & Screening - 12/12/16 1505      Initial Review   Current issues with Current Depression;Current Sleep Concerns;Current Stress Concerns   Source of Stress Concerns Chronic Illness;Unable to perform yard/household activities     [place]? Yes     Barriers   Psychosocial barriers to participate in program The patient should benefit from training in stress management and relaxation.     Screening Interventions   Interventions Yes;Encouraged to exercise;To provide support and resources with identified psychosocial needs   Expected Outcomes Short Term goal: Utilizing psychosocial counselor, staff and physician to assist with identification of specific Stressors or current issues interfering with healing process. Setting desired goal for each stressor or current issue identified.;Long Term Goal: Stressors or current issues are controlled or eliminated.;Short Term goal: Identification and review  with participant of any Quality of Life or Depression concerns found by scoring the questionnaire.;Long Term goal: The participant improves quality of Life and PHQ9 Scores as seen by post scores and/or verbalization of changes      Quality of Life Scores:   PHQ-9: Recent Review Flowsheet Data    Depression screen [facility] 2/9 01/24/2017 01/08/2017 12/12/2016   Decreased Interest 0 0 2   Down, Depressed, Hopeless 0 1 2   PHQ - 2 Score 0 1 4   Altered sleeping 1 1 2    Tired, decreased energy 2 3 2    Change in appetite 0 0 0   Feeling bad or failure about yourself  1 2 2    Trouble concentrating 2 3 3    Moving slowly or fidgety/restless 2 2 3    Suicidal thoughts 0 0 0   PHQ-9 Score 8 12 16    Difficult doing work/chores Somewhat difficult Very difficult Somewhat  difficult     Interpretation of Total Score  Total Score Depression Severity:  1-4 = Minimal depression, 5-9 = Mild depression, 10-14 = Moderate depression, 15-19 = Moderately severe depression, 20-27 = Severe depression   Psychosocial Evaluation and Intervention:     Psychosocial Evaluation - 01/03/17 1122      Psychosocial Evaluation & Interventions   Comments Follow up with Clair Gulling today by Counselor.  He reports the OTC natural sleep aids he has tried have improved his sleep to approx. 6 hours per night and this is a significant improvement.  He reports his energy level has increased and his anxiety has decreased as a result.  Clair Gulling continues to report being out of breath when walking for any period of time.  Counselor encouraged pacing himself in order to conserve his energy.  Counselor commended Clair Gulling on progress made in such a short time.  Staff will continue to follow with him.     Expected Outcomes Clair Gulling will continue to exercise consistently.  He will also begin to pace himself better when walking to conserve his energy and not be so out of breath upon arrival.  Staff will follow.     Continue Psychosocial Services  Follow up required by staff      Psychosocial Re-Evaluation:     Psychosocial Re-Evaluation    [place] Name 01/15/17 1730             Psychosocial Re-Evaluation   Current issues with Current Depression;Current Stress Concerns       Comments Macauley has been improving since the start of [facility]. He is inspiring to exercise with a group at work to help with stress and depression. He gets short of breath with incline but has been improving his workloads which make him feel better.        Expected Outcomes Short: Attened the relaxation education. Long: Maintain activities that can minimize stress.       Interventions Encouraged to attend Pulmonary Rehabilitation for the exercise;Stress management education;Relaxation education       Continue Psychosocial Services  Follow up  required by staff          Psychosocial Discharge (Final Psychosocial Re-Evaluation):     Psychosocial Re-Evaluation - 01/15/17 1730      Psychosocial Re-Evaluation   Current issues with Current Depression;Current Stress Concerns   Comments Arael has been improving since the start of [facility]. He is inspiring to exercise with a group at work to help with stress and depression. He gets short of breath with incline but has been improving  his workloads which make him feel better.    Expected Outcomes Short: Attened the relaxation education. Long: Maintain activities that can minimize stress.   Interventions Encouraged to attend Pulmonary Rehabilitation for the exercise;Stress management education;Relaxation education   Continue Psychosocial Services  Follow up required by staff      Education: Education Goals: Education classes will be provided on a weekly basis, covering required topics. Participant will state understanding/return demonstration of topics presented.  Learning Barriers/Preferences:     Learning Barriers/Preferences - 12/12/16 1518      Learning Barriers/Preferences   Learning Barriers Sight   Learning Preferences Skilled Demonstration      Education Topics: Initial Evaluation Education: - Verbal, written and demonstration of respiratory meds, RPE/PD scales, oximetry and breathing techniques. Instruction on use of nebulizers and MDIs: cleaning and proper use, rinsing mouth with steroid doses and importance of monitoring MDI activations.   Pulmonary Rehab from 01/22/2017 in [facility] Cardiac and Pulmonary Rehab  Date  12/20/16  Educator  [facility]  Instruction Review Code  1- Verbalizes Understanding      General Nutrition Guidelines/Fats and Fiber: -Group instruction provided by verbal, written material, models and posters to present the general guidelines for heart healthy nutrition. Gives an explanation and review of dietary fats and fiber.   Pulmonary Rehab from  01/22/2017 in [facility] Cardiac and Pulmonary Rehab  Date  01/15/17  Educator  CR  Instruction Review Code  1- Verbalizes Understanding      Controlling Sodium/Reading Food Labels: -Group verbal and written material supporting the discussion of sodium use in heart healthy nutrition. Review and explanation with models, verbal and written materials for utilization of the food label.   Pulmonary Rehab from 01/22/2017 in [facility] Cardiac and Pulmonary Rehab  Date  01/22/17  Educator  CR  Instruction Review Code  5- Refused Teaching      Exercise Physiology & Risk Factors: - Group verbal and written instruction with models to review the exercise physiology of the cardiovascular system and associated critical values. Details cardiovascular disease risk factors and the goals associated with each risk factor.   Pulmonary Rehab from 01/22/2017 in [facility] Cardiac and Pulmonary Rehab  Date  01/05/17  Educator  Nada Maclachlan, EP  Instruction Review Code  1- Verbalizes Understanding      Aerobic Exercise & Resistance Training: - Gives group verbal and written discussion on the health impact of inactivity. On the components of aerobic and resistive training programs and the benefits of this training and how to safely progress through these programs.   Flexibility, Balance, General Exercise Guidelines: - Provides group verbal and written instruction on the benefits of flexibility and balance training programs. Provides general exercise guidelines with specific guidelines to those with heart or lung disease. Demonstration and skill practice provided.   Stress Management: - Provides group verbal and written instruction about the health risks of elevated stress, cause of high stress, and healthy ways to reduce stress.   Pulmonary Rehab from 01/22/2017 in [facility] Cardiac and Pulmonary Rehab  Date  12/20/16  Educator  [facility]  Instruction Review Code  1- Verbalizes Understanding      Depression: - Provides group  verbal and written instruction on the correlation between heart/lung disease and depressed mood, treatment options, and the stigmas associated with seeking treatment.   Exercise & Equipment Safety: - Individual verbal instruction and demonstration of equipment use and safety with use of the equipment.   Pulmonary Rehab from 01/22/2017 in [facility] Cardiac and Pulmonary Rehab  Date  12/12/16  Educator  [facility]  Instruction Review Code  1- Verbalizes Understanding      Infection Prevention: - Provides verbal and written material to individual with discussion of infection control including proper hand washing and proper equipment cleaning during exercise session.   Pulmonary Rehab from 01/22/2017 in [facility] Cardiac and Pulmonary Rehab  Date  12/20/16  Educator  [facility]  Instruction Review Code  1- Verbalizes Understanding      Falls Prevention: - Provides verbal and written material to individual with discussion of falls prevention and safety.   Pulmonary Rehab from 01/22/2017 in [facility] Cardiac and Pulmonary Rehab  Date  12/20/16  Educator  [facility]  Instruction Review Code  1- Verbalizes Understanding      Diabetes: - Individual verbal and written instruction to review signs/symptoms of diabetes, desired ranges of glucose level fasting, after meals and with exercise. Advice that pre and post exercise glucose checks will be done for 3 sessions at entry of program.   Chronic Lung Diseases: - Group verbal and written instruction to review new updates, new respiratory medications, new advancements in procedures and treatments. Provide informative websites and "800" numbers of self-education.   Pulmonary Rehab from 01/22/2017 in [facility] Cardiac and Pulmonary Rehab  Date  01/03/17  Educator  [facility]  Instruction Review Code  1- Verbalizes Understanding      Lung Procedures: - Group verbal and written instruction to describe testing methods done to diagnose lung disease. Review the outcome of test results. Describe  the treatment choices: Pulmonary Function Tests, ABGs and oximetry.   Energy Conservation: - Provide group verbal and written instruction for methods to conserve energy, plan and organize activities. Instruct on pacing techniques, use of adaptive equipment and posture/positioning to relieve shortness of breath.   Triggers: - Group verbal and written instruction to review types of environmental controls: home humidity, furnaces, filters, dust mite/pet prevention, HEPA vacuums. To discuss weather changes, air quality and the benefits of nasal washing.   Exacerbations: - Group verbal and written instruction to provide: warning signs, infection symptoms, calling MD promptly, preventive modes, and value of vaccinations. Review: effective airway clearance, coughing and/or vibration techniques. Create an Sports administrator.   Oxygen: - Individual and group verbal and written instruction on oxygen therapy. Includes supplement oxygen, available portable oxygen systems, continuous and intermittent flow rates, oxygen safety, concentrators, and Medicare reimbursement for oxygen.   Pulmonary Rehab from 01/22/2017 in [facility] Cardiac and Pulmonary Rehab  Date  12/20/16  Educator  [facility]  Instruction Review Code  1- Verbalizes Understanding      Respiratory Medications: - Group verbal and written instruction to review medications for lung disease. Drug class, frequency, complications, importance of spacers, rinsing mouth after steroid MDI's, and proper cleaning methods for nebulizers.   Pulmonary Rehab from 01/22/2017 in [facility] Cardiac and Pulmonary Rehab  Date  12/12/16  Educator  [facility]  Instruction Review Code  1- Verbalizes Understanding      AED/CPR: - Group verbal and written instruction with the use of models to demonstrate the basic use of the AED with the basic ABC's of resuscitation.   Pulmonary Rehab from 01/22/2017 in [facility] Cardiac and Pulmonary Rehab  Date  12/22/16  Educator  CE  Instruction Review  Code  1- Verbalizes Understanding      Breathing Retraining: - Provides individuals verbal and written instruction on purpose, frequency, and proper technique of diaphragmatic breathing and pursed-lipped breathing. Applies individual practice skills.   Pulmonary Rehab from 01/22/2017 in  [place] Cardiac and Pulmonary Rehab  Date  12/20/16  Educator  [facility]  Instruction Review Code  1- Verbalizes Understanding      Anatomy and Physiology of the Lungs: - Group verbal and written instruction with the use of models to provide basic lung anatomy and physiology related to function, structure and complications of lung disease.   Anatomy & Physiology of the Heart: - Group verbal and written instruction and models provide basic cardiac anatomy and physiology, with the coronary electrical and arterial systems. Review of: AMI, Angina, Valve disease, Heart Failure, Cardiac Arrhythmia, Pacemakers, and the ICD.   Heart Failure: - Group verbal and written instruction on the basics of heart failure: signs/symptoms, treatments, explanation of ejection fraction, enlarged heart and cardiomyopathy.   Sleep Apnea: - Individual verbal and written instruction to review Obstructive Sleep Apnea. Review of risk factors, methods for diagnosing and types of masks and machines for OSA.   Anxiety: - Provides group, verbal and written instruction on the correlation between heart/lung disease and anxiety, treatment options, and management of anxiety.   Relaxation: - Provides group, verbal and written instruction about the benefits of relaxation for patients with heart/lung disease. Also provides patients with examples of relaxation techniques.   Pulmonary Rehab from 01/22/2017 in [facility] Cardiac and Pulmonary Rehab  Date  01/17/17  Educator  [facility]  Instruction Review Code  1- Verbalizes Understanding      Cardiac Medications: - Group verbal and written instruction to review commonly prescribed medications for heart  disease. Reviews the medication, class of the drug, and side effects.   Know Your Numbers: -Group verbal and written instruction about important numbers in your health.  Review of Cholesterol, Blood Pressure, Diabetes, and BMI and the role they play in your overall health.   Pulmonary Rehab from 01/22/2017 in [facility] Cardiac and Pulmonary Rehab  Date  01/12/17  Educator  [facility]  Instruction Review Code  1- Verbalizes Understanding      Other: -Provides group and verbal instruction on various topics (see comments)    Knowledge Questionnaire Score:     Knowledge Questionnaire Score - 01/24/17 1025      Knowledge Questionnaire Score   Pre Score 8/10   Post Score 9/10  reviewed with patient       Core Components/Risk Factors/Patient Goals at Admission:     Personal Goals and Risk Factors at Admission - 12/12/16 1414      Core Components/Risk Factors/Patient Goals on Admission    Weight Management Weight Maintenance;Yes   Intervention Weight Management: Develop a combined nutrition and exercise program designed to reach desired caloric intake, while maintaining appropriate intake of nutrient and fiber, sodium and fats, and appropriate energy expenditure required for the weight goal.;Weight Management: Provide education and appropriate resources to help participant work on and attain dietary goals.   Admit Weight 157 lb 11.2 oz (71.5 kg)   Goal Weight: Short Term 157 lb (71.2 kg)   Goal Weight: Long Term 155 lb (70.3 kg)   Expected Outcomes Short Term: Continue to assess and modify interventions until short term weight is achieved;Long Term: Adherence to nutrition and physical activity/exercise program aimed toward attainment of established weight goal;Weight Maintenance: Understanding of the daily nutrition guidelines, which includes 25-35% calories from fat, 7% or less cal from saturated fats, less than 232m cholesterol, less than 1.5gm of sodium, & 5 or more servings of fruits and  vegetables daily   Improve shortness of breath with ADL's Yes   Intervention Provide education,  individualized exercise plan and daily activity instruction to help decrease symptoms of SOB with activities of daily living.   Expected Outcomes Short Term: Achieves a reduction of symptoms when performing activities of daily living.   Stress Yes   Intervention Offer individual and/or small group education and counseling on adjustment to heart disease, stress management and health-related lifestyle change. Teach and support self-help strategies.;Refer participants experiencing significant psychosocial distress to appropriate mental health specialists for further evaluation and treatment. When possible, include family members and significant others in education/counseling sessions.   Expected Outcomes Short Term: Participant demonstrates changes in health-related behavior, relaxation and other stress management skills, ability to obtain effective social support, and compliance with psychotropic medications if prescribed.;Long Term: Emotional wellbeing is indicated by absence of clinically significant psychosocial distress or social isolation.      Core Components/Risk Factors/Patient Goals Review:    Core Components/Risk Factors/Patient Goals at Discharge (Final Review):    ITP Comments:     ITP Comments    Row Name 12/12/16 1411 12/25/16 0821 01/12/17 1104 01/22/17 0816     ITP Comments Medical Evaluation completed. Chart sent for review and changes to Dr. Emily Filbert director of [place]. 30 day review completed. ITP sent to Dr. Emily Filbert Director of [place]. Continue with ITP unless changes are made by physician.   [employer] insurance is going to change November 1st and will not be able to afford the program. He is going to attend [facility] until that time. 30 day review completed. ITP sent to Dr. Emily Filbert Director of [place]. Continue with ITP unless changes are made by physician.          Comments: Discharge ITP

## 2017-01-29 NOTE — Patient Instructions (Signed)
Discharge Instructions  Patient Details  Name: Bryan Montoya MRN: [id number] Date of Birth: 27-Nov-1955 Referring Provider:  Leonel Ramsay, MD   Number of Visits: 19  Reason for Discharge:  Patient reached a stable level of exercise. Patient independent in their exercise. Patient has met program and personal goals. Early Exit:  Insurance  Smoking History:  History  Smoking Status  . Former Smoker  . Packs/day: 1.00  . Years: 30.00  . Types: Cigarettes  . Quit date: 01/01/2006  Smokeless Tobacco  . Never Used    Comment: patient does not smoke    Diagnosis:  Chronic obstructive pulmonary disease, unspecified COPD type ([place])  Initial Exercise Prescription:     Initial Exercise Prescription - 12/12/16 1600      Date of Initial Exercise RX and Referring Provider   Date 12/12/16   Referring Provider Ancil Linsey MD     Oxygen   Oxygen Continuous   Liters 2     Treadmill   MPH 2.6   Grade 0.5   Minutes 15   METs 3.17     NuStep   Level 3   SPM 80   Minutes 15   METs 3     REL-XR   Level 2   Speed 50   Minutes 15   METs 3     Prescription Details   Frequency (times per week) 3   Duration Progress to 45 minutes of aerobic exercise without signs/symptoms of physical distress     Intensity   THRR 40-80% of Max Heartrate 93-136   Ratings of Perceived Exertion 11-13   Perceived Dyspnea 0-4     Progression   Progression Continue to progress workloads to maintain intensity without signs/symptoms of physical distress.     Resistance Training   Training Prescription Yes   Weight 3 lbs   Reps 10-15      Discharge Exercise Prescription (Final Exercise Prescription Changes):     Exercise Prescription Changes - 01/24/17 1200      Response to Exercise   Blood Pressure (Admit) 122/64   Blood Pressure (Exit) 114/62   Heart Rate (Admit) 60 bpm   Heart Rate (Exercise) 103 bpm   Heart Rate (Exit) 58 bpm   Oxygen Saturation (Admit) 97 %    Oxygen Saturation (Exercise) 91 %   Oxygen Saturation (Exit) 95 %   Rating of Perceived Exertion (Exercise) 13   Perceived Dyspnea (Exercise) 2   Symptoms none   Duration Continue with 45 min of aerobic exercise without signs/symptoms of physical distress.   Intensity THRR unchanged     Progression   Progression Continue to progress workloads to maintain intensity without signs/symptoms of physical distress.   Average METs 3.87     Resistance Training   Training Prescription Yes   Weight 7 lb   Reps 10-15     Oxygen   Oxygen Continuous   Liters 2  TM only     Treadmill   MPH 2.8   Grade 2.5   Minutes 15   METs 4.11     NuStep   Level 4   SPM 90   Minutes 15   METs 3.1     REL-XR   Level 6   Speed 55   Minutes 15   METs 4.4     Home Exercise Plan   Plans to continue exercise at Home (comment)  He is thinking of joinin [employer]   Frequency Add 4 additional days to  program exercise sessions.   Initial Home Exercises Provided 12/27/16      Functional Capacity:     6 Minute Walk    Row Name 12/12/16 1610 01/26/17 1043       6 Minute Walk   Phase Initial Discharge    Distance 1420 feet  wearing back brace 1600 feet    Distance % Change  - 12.6 %    Distance Feet Change  - 180 ft    Walk Time 6 minutes 6 minutes    # of Rest Breaks 0 0    MPH 2.69 3.03    METS 3.34 4.27    RPE 13 13    Perceived Dyspnea  2 2    VO2 Peak 11.7 14.96    Symptoms No No    Resting HR 51 bpm 68 bpm    Resting BP 128/64 124/62    Resting Oxygen Saturation  98 % 95 %    Exercise Oxygen Saturation  during 6 min walk 84 % 90 %    Max Ex. HR 61 bpm 111 bpm    Max Ex. BP 134/74 150/74    2 Minute Post BP 122/70  -      Interval HR   1 Minute HR 58 81    2 Minute HR 61 85    3 Minute HR  - 111    4 Minute HR 58 105    5 Minute HR 58 103    6 Minute HR 61 97    2 Minute Post HR 53 79    Interval Heart Rate? Yes Yes      Interval Oxygen   Interval Oxygen? Yes Yes     Baseline Oxygen Saturation % 98 % 95 %    1 Minute Oxygen Saturation % 96 % 96 %    1 Minute Liters of Oxygen 2 L  pulsed 2 L    2 Minute Oxygen Saturation % 94 % 94 %    2 Minute Liters of Oxygen 2 L 2 L    3 Minute Oxygen Saturation % 89 % 90 %    3 Minute Liters of Oxygen 2 L 2 L    4 Minute Oxygen Saturation % 91 % 90 %    4 Minute Liters of Oxygen 2 L 2 L    5 Minute Oxygen Saturation % 92 % 90 %    5 Minute Liters of Oxygen 2 L 2 L    6 Minute Oxygen Saturation % 84 % 90 %    6 Minute Liters of Oxygen 2 L 2 L    2 Minute Post Oxygen Saturation % 100 % 95 %    2 Minute Post Liters of Oxygen 2 L 2 L       Quality of Life:   Personal Goals: Goals established at orientation with interventions provided to work toward goal.     Personal Goals and Risk Factors at Admission - 12/12/16 1414      Core Components/Risk Factors/Patient Goals on Admission    Weight Management Weight Maintenance;Yes   Intervention Weight Management: Develop a combined nutrition and exercise program designed to reach desired caloric intake, while maintaining appropriate intake of nutrient and fiber, sodium and fats, and appropriate energy expenditure required for the weight goal.;Weight Management: Provide education and appropriate resources to help participant work on and attain dietary goals.   Admit Weight 157 lb 11.2 oz (71.5 kg)   Goal Weight: Short Term  157 lb (71.2 kg)   Goal Weight: Long Term 155 lb (70.3 kg)   Expected Outcomes Short Term: Continue to assess and modify interventions until short term weight is achieved;Long Term: Adherence to nutrition and physical activity/exercise program aimed toward attainment of established weight goal;Weight Maintenance: Understanding of the daily nutrition guidelines, which includes 25-35% calories from fat, 7% or less cal from saturated fats, less than 215m cholesterol, less than 1.5gm of sodium, & 5 or more servings of fruits and vegetables daily    Improve shortness of breath with ADL's Yes   Intervention Provide education, individualized exercise plan and daily activity instruction to help decrease symptoms of SOB with activities of daily living.   Expected Outcomes Short Term: Achieves a reduction of symptoms when performing activities of daily living.   Stress Yes   Intervention Offer individual and/or small group education and counseling on adjustment to heart disease, stress management and health-related lifestyle change. Teach and support self-help strategies.;Refer participants experiencing significant psychosocial distress to appropriate mental health specialists for further evaluation and treatment. When possible, include family members and significant others in education/counseling sessions.   Expected Outcomes Short Term: Participant demonstrates changes in health-related behavior, relaxation and other stress management skills, ability to obtain effective social support, and compliance with psychotropic medications if prescribed.;Long Term: Emotional wellbeing is indicated by absence of clinically significant psychosocial distress or social isolation.       Personal Goals Discharge:   Exercise Goals and Review:     Exercise Goals    Row Name 12/12/16 1613             Exercise Goals   Increase Physical Activity Yes       Intervention Provide advice, education, support and counseling about physical activity/exercise needs.;Develop an individualized exercise prescription for aerobic and resistive training based on initial evaluation findings, risk stratification, comorbidities and participant's personal goals.       Expected Outcomes Achievement of increased cardiorespiratory fitness and enhanced flexibility, muscular endurance and strength shown through measurements of functional capacity and personal statement of participant.       Increase Strength and Stamina Yes       Intervention Provide advice, education, support and  counseling about physical activity/exercise needs.;Develop an individualized exercise prescription for aerobic and resistive training based on initial evaluation findings, risk stratification, comorbidities and participant's personal goals.       Expected Outcomes Achievement of increased cardiorespiratory fitness and enhanced flexibility, muscular endurance and strength shown through measurements of functional capacity and personal statement of participant.       Able to understand and use rate of perceived exertion (RPE) scale Yes       Intervention Provide education and explanation on how to use RPE scale       Expected Outcomes Short Term: Able to use RPE daily in rehab to express subjective intensity level;Long Term:  Able to use RPE to guide intensity level when exercising independently       Able to understand and use Dyspnea scale Yes       Intervention Provide education and explanation on how to use Dyspnea scale       Expected Outcomes Short Term: Able to use Dyspnea scale daily in rehab to express subjective sense of shortness of breath during exertion;Long Term: Able to use Dyspnea scale to guide intensity level when exercising independently       Knowledge and understanding of Target Heart Rate Range (THRR) Yes  Intervention Provide education and explanation of THRR including how the numbers were predicted and where they are located for reference       Expected Outcomes Short Term: Able to state/look up THRR;Long Term: Able to use THRR to govern intensity when exercising independently;Short Term: Able to use daily as guideline for intensity in rehab       Able to check pulse independently Yes       Intervention Provide education and demonstration on how to check pulse in carotid and radial arteries.;Review the importance of being able to check your own pulse for safety during independent exercise       Expected Outcomes Short Term: Able to explain why pulse checking is important during  independent exercise;Long Term: Able to check pulse independently and accurately       Understanding of Exercise Prescription Yes       Intervention Provide education, explanation, and written materials on patient's individual exercise prescription       Expected Outcomes Short Term: Able to explain program exercise prescription;Long Term: Able to explain home exercise prescription to exercise independently          Nutrition & Weight - Outcomes:     Pre Biometrics - 12/12/16 1616      Pre Biometrics   Height 5' 7.9" (1.725 m)   Weight 157 lb 11.2 oz (71.5 kg)   Waist Circumference 38 inches   Hip Circumference 38.25 inches   Waist to Hip Ratio 0.99 %   BMI (Calculated) 24.04         Post Biometrics - 01/26/17 1047       Post  Biometrics   Height 5' 7.9" (1.725 m)   Weight 159 lb (72.1 kg)   Waist Circumference 37.5 inches   Hip Circumference 38.25 inches   Waist to Hip Ratio 0.98 %   BMI (Calculated) 24.24      Nutrition:     Nutrition Therapy & Goals - 01/12/17 1429      Nutrition Therapy   RD appointment defered Yes      Nutrition Discharge:     Nutrition Assessments - 12/12/16 1546      MEDFICTS Scores   Pre Score 70      Education Questionnaire Score:     Knowledge Questionnaire Score - 01/24/17 1025      Knowledge Questionnaire Score   Pre Score 8/10   Post Score 9/10  reviewed with patient      Goals reviewed with patient; copy given to patient.

## 2017-01-29 NOTE — Progress Notes (Signed)
Discharge Progress Report  Patient Details  Name: Bryan Montoya MRN: [id number] Date of Birth: [birth year] Referring Provider:     [facility] from 12/12/2016 in [place] [facility]  Referring Provider  Ancil Linsey MD       Number of Visits: 19  Reason for Discharge:  Patient reached a stable level of exercise. Patient independent in their exercise. Patient has met program and personal goals. Early Exit:  Insurance  Smoking History:  History  Smoking Status  . Former Smoker  . Packs/day: 1.00  . Years: 30.00  . Types: Cigarettes  . Quit date: 01/01/2006  Smokeless Tobacco  . Never Used    Comment: patient does not smoke    Diagnosis:  Chronic obstructive pulmonary disease, unspecified COPD type ([place])  ADL [facility]SD:     Pulmonary Assessment Scores    Row Name 12/12/16 1459 01/24/17 1028       ADL [facility]SD   ADL Phase Entry Exit    SOB Score total 44 56    Rest 0 0    Walk 1 0    Stairs 5 4    Bath 2 2    Dress 1 2    Shop 1 3      CAT Score   CAT Score 22 19      mMRC Score   mMRC Score 2  -       Initial Exercise Prescription:     Initial Exercise Prescription - 12/12/16 1600      Date of Initial Exercise RX and Referring Provider   Date 12/12/16   Referring Provider Ancil Linsey MD     Oxygen   Oxygen Continuous   Liters 2     Treadmill   MPH 2.6   Grade 0.5   Minutes 15   METs 3.17     NuStep   Level 3   SPM 80   Minutes 15   METs 3     REL-XR   Level 2   Speed 50   Minutes 15   METs 3     Prescription Details   Frequency (times per week) 3   Duration Progress to 45 minutes of aerobic exercise without signs/symptoms of physical distress     Intensity   THRR 40-80% of Max Heartrate 93-136   Ratings of Perceived Exertion 11-13   Perceived Dyspnea 0-4     Progression   Progression Continue to progress workloads to maintain intensity without signs/symptoms of physical distress.     Resistance Training   Training Prescription Yes   Weight 3 lbs   Reps 10-15      Discharge Exercise Prescription (Final Exercise Prescription Changes):     Exercise Prescription Changes - 01/24/17 1200      Response to Exercise   Blood Pressure (Admit) 122/64   Blood Pressure (Exit) 114/62   Heart Rate (Admit) 60 bpm   Heart Rate (Exercise) 103 bpm   Heart Rate (Exit) 58 bpm   Oxygen Saturation (Admit) 97 %   Oxygen Saturation (Exercise) 91 %   Oxygen Saturation (Exit) 95 %   Rating of Perceived Exertion (Exercise) 13   Perceived Dyspnea (Exercise) 2   Symptoms none   Duration Continue with 45 min of aerobic exercise without signs/symptoms of physical distress.   Intensity THRR unchanged     Progression   Progression Continue to progress workloads to maintain intensity without signs/symptoms of physical distress.   Average METs 3.87  Resistance Training   Training Prescription Yes   Weight 7 lb   Reps 10-15     Oxygen   Oxygen Continuous   Liters 2  TM only     Treadmill   MPH 2.8   Grade 2.5   Minutes 15   METs 4.11     NuStep   Level 4   SPM 90   Minutes 15   METs 3.1     REL-XR   Level 6   Speed 55   Minutes 15   METs 4.4     Home Exercise Plan   Plans to continue exercise at Home (comment)  He is thinking of joinin [employer]   Frequency Add 4 additional days to program exercise sessions.   Initial Home Exercises Provided 12/27/16      Functional Capacity:     6 Minute Walk    Row Name 12/12/16 1610 01/26/17 1043       6 Minute Walk   Phase Initial Discharge    Distance 1420 feet  wearing back brace 1600 feet    Distance % Change  - 12.6 %    Distance Feet Change  - 180 ft    Walk Time 6 minutes 6 minutes    # of Rest Breaks 0 0    MPH 2.69 3.03    METS 3.34 4.27    RPE 13 13    Perceived Dyspnea  2 2    VO2 Peak 11.7 14.96    Symptoms No No    Resting HR 51 bpm 68 bpm    Resting BP 128/64 124/62    Resting Oxygen Saturation  98 % 95 %     Exercise Oxygen Saturation  during 6 min walk 84 % 90 %    Max Ex. HR 61 bpm 111 bpm    Max Ex. BP 134/74 150/74    2 Minute Post BP 122/70  -      Interval HR   1 Minute HR 58 81    2 Minute HR 61 85    3 Minute HR  - 111    4 Minute HR 58 105    5 Minute HR 58 103    6 Minute HR 61 97    2 Minute Post HR 53 79    Interval Heart Rate? Yes Yes      Interval Oxygen   Interval Oxygen? Yes Yes    Baseline Oxygen Saturation % 98 % 95 %    1 Minute Oxygen Saturation % 96 % 96 %    1 Minute Liters of Oxygen 2 L  pulsed 2 L    2 Minute Oxygen Saturation % 94 % 94 %    2 Minute Liters of Oxygen 2 L 2 L    3 Minute Oxygen Saturation % 89 % 90 %    3 Minute Liters of Oxygen 2 L 2 L    4 Minute Oxygen Saturation % 91 % 90 %    4 Minute Liters of Oxygen 2 L 2 L    5 Minute Oxygen Saturation % 92 % 90 %    5 Minute Liters of Oxygen 2 L 2 L    6 Minute Oxygen Saturation % 84 % 90 %    6 Minute Liters of Oxygen 2 L 2 L    2 Minute Post Oxygen Saturation % 100 % 95 %    2 Minute Post Liters of Oxygen 2 L  2 L       Psychological, QOL, Others - Outcomes: PHQ 2/9: Depression screen [facility] 2/9 01/24/2017 01/08/2017 12/12/2016  Decreased Interest 0 0 2  Down, Depressed, Hopeless 0 1 2  PHQ - 2 Score 0 1 4  Altered sleeping _0 Tired, decreased energy _1 Change in appetite 0 0 0  Feeling bad or failure about yourself  _2 Trouble concentrating _3 Moving slowly or fidgety/restless _4 Suicidal thoughts 0 0 0  PHQ-9 Score _5 Difficult doing work/chores Somewhat difficult Very difficult Somewhat difficult    Quality of Life:   Personal Goals: Goals established at orientation with interventions provided to work toward goal.     Personal Goals and Risk Factors at Admission - 12/12/16 1414      Core Components/Risk Factors/Patient Goals on Admission    Weight Management Weight Maintenance;Yes   Intervention Weight Management: Develop a combined nutrition and  exercise program designed to reach desired caloric intake, while maintaining appropriate intake of nutrient and fiber, sodium and fats, and appropriate energy expenditure required for the weight goal.;Weight Management: Provide education and appropriate resources to help participant work on and attain dietary goals.   Admit Weight 157 lb 11.2 oz (71.5 kg)   Goal Weight: Short Term 157 lb (71.2 kg)   Goal Weight: Long Term 155 lb (70.3 kg)   Expected Outcomes Short Term: Continue to assess and modify interventions until short term weight is achieved;Long Term: Adherence to nutrition and physical activity/exercise program aimed toward attainment of established weight goal;Weight Maintenance: Understanding of the daily nutrition guidelines, which includes 25-35% calories from fat, 7% or less cal from saturated fats, less than 233m cholesterol, less than 1.5gm of sodium, & 5 or more servings of fruits and vegetables daily   Improve shortness of breath with ADL's Yes   Intervention Provide education, individualized exercise plan and daily activity instruction to help decrease symptoms of SOB with activities of daily living.   Expected Outcomes Short Term: Achieves a reduction of symptoms when performing activities of daily living.   Stress Yes   Intervention Offer individual and/or small group education and counseling on adjustment to heart disease, stress management and health-related lifestyle change. Teach and support self-help strategies.;Refer participants experiencing significant psychosocial distress to appropriate mental health specialists for further evaluation and treatment. When possible, include family members and significant others in education/counseling sessions.   Expected Outcomes Short Term: Participant demonstrates changes in health-related behavior, relaxation and other stress management skills, ability to obtain effective social support, and compliance with psychotropic medications if  prescribed.;Long Term: Emotional wellbeing is indicated by absence of clinically significant psychosocial distress or social isolation.       Personal Goals Discharge:   Exercise Goals and Review:     Exercise Goals    Row Name 12/12/16 1613             Exercise Goals   Increase Physical Activity Yes       Intervention Provide advice, education, support and counseling about physical activity/exercise needs.;Develop an individualized exercise prescription for aerobic and resistive training based on initial evaluation findings, risk stratification, comorbidities and participant's personal goals.       Expected Outcomes Achievement of increased cardiorespiratory fitness and enhanced flexibility, muscular endurance and strength shown through measurements of functional capacity and personal statement of participant.       Increase Strength and Stamina Yes  Intervention Provide advice, education, support and counseling about physical activity/exercise needs.;Develop an individualized exercise prescription for aerobic and resistive training based on initial evaluation findings, risk stratification, comorbidities and participant's personal goals.       Expected Outcomes Achievement of increased cardiorespiratory fitness and enhanced flexibility, muscular endurance and strength shown through measurements of functional capacity and personal statement of participant.       Able to understand and use rate of perceived exertion (RPE) scale Yes       Intervention Provide education and explanation on how to use RPE scale       Expected Outcomes Short Term: Able to use RPE daily in rehab to express subjective intensity level;Long Term:  Able to use RPE to guide intensity level when exercising independently       Able to understand and use Dyspnea scale Yes       Intervention Provide education and explanation on how to use Dyspnea scale       Expected Outcomes Short Term: Able to use Dyspnea scale  daily in rehab to express subjective sense of shortness of breath during exertion;Long Term: Able to use Dyspnea scale to guide intensity level when exercising independently       Knowledge and understanding of Target Heart Rate Range (THRR) Yes       Intervention Provide education and explanation of THRR including how the numbers were predicted and where they are located for reference       Expected Outcomes Short Term: Able to state/look up THRR;Long Term: Able to use THRR to govern intensity when exercising independently;Short Term: Able to use daily as guideline for intensity in rehab       Able to check pulse independently Yes       Intervention Provide education and demonstration on how to check pulse in carotid and radial arteries.;Review the importance of being able to check your own pulse for safety during independent exercise       Expected Outcomes Short Term: Able to explain why pulse checking is important during independent exercise;Long Term: Able to check pulse independently and accurately       Understanding of Exercise Prescription Yes       Intervention Provide education, explanation, and written materials on patient's individual exercise prescription       Expected Outcomes Short Term: Able to explain program exercise prescription;Long Term: Able to explain home exercise prescription to exercise independently          Nutrition & Weight - Outcomes:     Pre Biometrics - 12/12/16 1616      Pre Biometrics   Height 5' 7.9" (1.725 m)   Weight 157 lb 11.2 oz (71.5 kg)   Waist Circumference 38 inches   Hip Circumference 38.25 inches   Waist to Hip Ratio 0.99 %   BMI (Calculated) 24.04         Post Biometrics - 01/26/17 1047       Post  Biometrics   Height 5' 7.9" (1.725 m)   Weight 159 lb (72.1 kg)   Waist Circumference 37.5 inches   Hip Circumference 38.25 inches   Waist to Hip Ratio 0.98 %   BMI (Calculated) 24.24      Nutrition:     Nutrition Therapy & Goals  - 01/12/17 1429      Nutrition Therapy   RD appointment defered Yes      Nutrition Discharge:     Nutrition Assessments - 12/12/16 1546  MEDFICTS Scores   Pre Score 70      Education Questionnaire Score:     Knowledge Questionnaire Score - 01/24/17 1025      Knowledge Questionnaire Score   Pre Score 8/10   Post Score 9/10  reviewed with patient      Goals reviewed with patient; copy given to patient.

## 2017-01-29 NOTE — Progress Notes (Signed)
Daily Session Note  Patient Details  Name: Bryan Montoya MRN: [id number] Date of Birth: May 14, 1955 Referring Provider:     Pulmonary Rehab from 12/12/2016 in [facility]  Referring Provider  Ancil Linsey MD      Encounter Date: 01/29/2017  Check In:     Session Check In - 01/29/17 0956      Check-In   Location [facility]-Cardiac [facility] Pulmonary Rehab   Staff Present Earlean Shawl, BS, ACSM CEP, Exercise Physiologist;Amanda Oletta Darter, BA, ACSM CEP, Exercise Physiologist;Xin Klawitter Flavia Shipper   Supervising physician immediately available to respond to emergencies [facility] immediately available ER MD   Physician(s) Dr. Cinda Quest and Reita Cliche   Medication changes reported     No   Fall or balance concerns reported    No   Warm-up and Cool-down Performed as group-led instruction   Resistance Training Performed Yes   VAD Patient? No     Pain Assessment   Currently in Pain? No/denies         History  Smoking Status  . Former Smoker  . Packs/day: 1.00  . Years: 30.00  . Types: Cigarettes  . Quit date: 01/01/2006  Smokeless Tobacco  . Never Used    Comment: patient does not smoke    Goals Met:  Proper associated with RPD/PD & O2 Sat Independence with exercise equipment Exercise tolerated well No report of cardiac concerns or symptoms Strength training completed today  Goals Unmet:  Not Applicable  Comments:  [name] graduated today from cardiac rehab with 19 sessions completed.  Details of the patient's exercise prescription and what He needs to do in order to continue the prescription and progress were discussed with patient.  Patient was given a copy of prescription and goals.  Patient verbalized understanding.  [name] plans to continue to exercise by working out at home.   Dr. Emily Filbert is Medical Director for [place] and [facility].

## 2017-08-12 ENCOUNTER — Emergency Department: Payer: BLUE CROSS/BLUE SHIELD

## 2017-08-12 ENCOUNTER — Encounter: Payer: Self-pay | Admitting: Emergency Medicine

## 2017-08-12 ENCOUNTER — Other Ambulatory Visit: Payer: Self-pay

## 2017-08-12 ENCOUNTER — Inpatient Hospital Stay
Admission: EM | Admit: 2017-08-12 | Discharge: 2017-08-13 | DRG: 871 | Disposition: A | Discharge status: Home / Self Care | Payer: BLUE CROSS/BLUE SHIELD | Attending: Internal Medicine | Admitting: Internal Medicine

## 2017-08-12 DIAGNOSIS — J440 Chronic obstructive pulmonary disease with acute lower respiratory infection: Secondary | ICD-10-CM | POA: Diagnosis present

## 2017-08-12 DIAGNOSIS — J441 Chronic obstructive pulmonary disease with (acute) exacerbation: Secondary | ICD-10-CM | POA: Diagnosis present

## 2017-08-12 DIAGNOSIS — Z79899 Other long term (current) drug therapy: Secondary | ICD-10-CM

## 2017-08-12 DIAGNOSIS — Z87891 Personal history of nicotine dependence: Secondary | ICD-10-CM

## 2017-08-12 DIAGNOSIS — J189 Pneumonia, unspecified organism: Secondary | ICD-10-CM | POA: Diagnosis present

## 2017-08-12 DIAGNOSIS — A419 Sepsis, unspecified organism: Principal | ICD-10-CM | POA: Diagnosis present

## 2017-08-12 DIAGNOSIS — Z885 Allergy status to narcotic agent status: Secondary | ICD-10-CM

## 2017-08-12 DIAGNOSIS — J9611 Chronic respiratory failure with hypoxia: Secondary | ICD-10-CM | POA: Diagnosis present

## 2017-08-12 DIAGNOSIS — Z791 Long term (current) use of non-steroidal anti-inflammatories (NSAID): Secondary | ICD-10-CM | POA: Diagnosis not present

## 2017-08-12 DIAGNOSIS — Z7951 Long term (current) use of inhaled steroids: Secondary | ICD-10-CM | POA: Diagnosis not present

## 2017-08-12 DIAGNOSIS — F419 Anxiety disorder, unspecified: Secondary | ICD-10-CM | POA: Diagnosis present

## 2017-08-12 DIAGNOSIS — K219 Gastro-esophageal reflux disease without esophagitis: Secondary | ICD-10-CM | POA: Diagnosis present

## 2017-08-12 DIAGNOSIS — I10 Essential (primary) hypertension: Secondary | ICD-10-CM | POA: Diagnosis present

## 2017-08-12 HISTORY — DX: Gastro-esophageal reflux disease without esophagitis: K21.9

## 2017-08-12 HISTORY — DX: Unspecified osteoarthritis, unspecified site: M19.90

## 2017-08-12 HISTORY — DX: Chronic obstructive pulmonary disease, unspecified: J44.9

## 2017-08-12 LAB — TROPONIN I: Troponin I: 0.03 ng/mL (ref <0.03)

## 2017-08-12 LAB — URINALYSIS, ROUTINE W REFLEX MICROSCOPIC
Bacteria, UA: NONE SEEN
Bilirubin Urine: NEGATIVE
Glucose, UA: NEGATIVE mg/dL
Hgb urine dipstick: NEGATIVE
Ketones, ur: 5 mg/dL — AB
Leukocytes, UA: NEGATIVE
Nitrite: NEGATIVE
PROTEIN: 30 mg/dL — AB
Specific Gravity, Urine: 1.02 (ref 1.005–1.030)
pH: 7 (ref 5.0–8.0)

## 2017-08-12 LAB — COMPREHENSIVE METABOLIC PANEL
ALBUMIN: 3.5 g/dL (ref 3.5–5.0)
ALT: 20 U/L (ref 17–63)
ANION GAP: 7 (ref 5–15)
AST: 21 U/L (ref 15–41)
Alkaline Phosphatase: 74 U/L (ref 38–126)
BILIRUBIN TOTAL: 1.3 mg/dL — AB (ref 0.3–1.2)
BUN: 16 mg/dL (ref 6–20)
CHLORIDE: 103 mmol/L (ref 101–111)
CO2: 25 mmol/L (ref 22–32)
Calcium: 8.3 mg/dL — ABNORMAL LOW (ref 8.9–10.3)
Creatinine, Ser: 0.72 mg/dL (ref 0.61–1.24)
GFR calc Af Amer: >60 mL/min (ref >60)
Glucose, Bld: 131 mg/dL — ABNORMAL HIGH (ref 65–99)
POTASSIUM: 4.1 mmol/L (ref 3.5–5.1)
Sodium: 135 mmol/L (ref 135–145)
TOTAL PROTEIN: 6.7 g/dL (ref 6.5–8.1)

## 2017-08-12 LAB — CBC WITH DIFFERENTIAL/PLATELET
BASOS ABS: 0 10*3/uL (ref 0–0.1)
BASOS PCT: 0 %
EOS PCT: 0 %
Eosinophils Absolute: 0 10*3/uL (ref 0–0.7)
HCT: 40.3 % (ref 40.0–52.0)
Hemoglobin: 14.1 g/dL (ref 13.0–18.0)
Lymphocytes Relative: 5 %
Lymphs Abs: 0.7 10*3/uL — ABNORMAL LOW (ref 1.0–3.6)
MCH: 31.4 pg (ref 26.0–34.0)
MCHC: 34.9 g/dL (ref 32.0–36.0)
MCV: 89.8 fL (ref 80.0–100.0)
MONO ABS: 0.7 10*3/uL (ref 0.2–1.0)
MONOS PCT: 5 %
Neutro Abs: 12.8 10*3/uL — ABNORMAL HIGH (ref 1.4–6.5)
Neutrophils Relative %: 90 %
PLATELETS: 211 10*3/uL (ref 150–440)
RBC: 4.49 MIL/uL (ref 4.40–5.90)
RDW: 14.2 % (ref 11.5–14.5)
WBC: 14.3 10*3/uL — ABNORMAL HIGH (ref 3.8–10.6)

## 2017-08-12 LAB — LACTIC ACID, PLASMA: LACTIC ACID, VENOUS: 1.1 mmol/L (ref 0.5–1.9)

## 2017-08-12 MED ORDER — GABAPENTIN 300 MG PO CAPS
300.0000 mg | ORAL_CAPSULE | Freq: Every day | ORAL | Status: DC
  Administered 2017-08-12: 23:00:00 300 mg via ORAL
  Filled 2017-08-12: qty 1

## 2017-08-12 MED ORDER — SODIUM CHLORIDE 0.9 % IV SOLN
Freq: Once | INTRAVENOUS | Status: AC
  Administered 2017-08-12: 11:00:00 via INTRAVENOUS

## 2017-08-12 MED ORDER — POLYETHYLENE GLYCOL 3350 17 G PO PACK: 17.0000 g | PACK | Freq: Every day | ORAL | Status: DC | PRN

## 2017-08-12 MED ORDER — SODIUM CHLORIDE 0.9 % IV SOLN
INTRAVENOUS | Status: DC
  Administered 2017-08-12 – 2017-08-13 (×2): via INTRAVENOUS

## 2017-08-12 MED ORDER — IPRATROPIUM-ALBUTEROL 0.5-2.5 (3) MG/3ML IN SOLN
3.0000 mL | Freq: Once | RESPIRATORY_TRACT | Status: AC
  Administered 2017-08-12: 3 mL via RESPIRATORY_TRACT
  Filled 2017-08-12: qty 3

## 2017-08-12 MED ORDER — ENOXAPARIN SODIUM 40 MG/0.4ML ~~LOC~~ SOLN
40.0000 mg | SUBCUTANEOUS | Status: DC
  Administered 2017-08-12: 40 mg via SUBCUTANEOUS
  Filled 2017-08-12: qty 0.4

## 2017-08-12 MED ORDER — ACETAMINOPHEN 325 MG PO TABS: 650.0000 mg | ORAL_TABLET | Freq: Four times a day (QID) | ORAL | Status: DC | PRN

## 2017-08-12 MED ORDER — SODIUM CHLORIDE 0.9 % IV SOLN
500.0000 mg | INTRAVENOUS | Status: DC
  Filled 2017-08-12: qty 500

## 2017-08-12 MED ORDER — UMECLIDINIUM BROMIDE 62.5 MCG/INH IN AEPB
1.0000 | INHALATION_SPRAY | Freq: Every day | RESPIRATORY_TRACT | Status: DC
  Filled 2017-08-12: qty 7

## 2017-08-12 MED ORDER — AZITHROMYCIN 500 MG IV SOLR
500.0000 mg | INTRAVENOUS | Status: DC
  Administered 2017-08-12: 500 mg via INTRAVENOUS
  Filled 2017-08-12: qty 500

## 2017-08-12 MED ORDER — METHYLPREDNISOLONE SODIUM SUCC 125 MG IJ SOLR: 60.0000 mg | INTRAMUSCULAR | Status: DC

## 2017-08-12 MED ORDER — HYDROCODONE-ACETAMINOPHEN 5-325 MG PO TABS: 1.0000 | ORAL_TABLET | ORAL | Status: DC | PRN

## 2017-08-12 MED ORDER — SODIUM CHLORIDE 0.9 % IV SOLN: 500.0000 mg | INTRAVENOUS | Status: DC

## 2017-08-12 MED ORDER — ONDANSETRON HCL 4 MG PO TABS: 4.0000 mg | ORAL_TABLET | Freq: Four times a day (QID) | ORAL | Status: DC | PRN

## 2017-08-12 MED ORDER — PANTOPRAZOLE SODIUM 40 MG PO TBEC
40.0000 mg | DELAYED_RELEASE_TABLET | Freq: Every day | ORAL | Status: DC
  Administered 2017-08-13: 10:00:00 40 mg via ORAL
  Filled 2017-08-12: qty 1

## 2017-08-12 MED ORDER — SODIUM CHLORIDE 0.9 % IV SOLN
1.0000 g | INTRAVENOUS | Status: DC
  Administered 2017-08-13: 10:00:00 1 g via INTRAVENOUS
  Filled 2017-08-12: qty 1

## 2017-08-12 MED ORDER — SODIUM CHLORIDE 0.9 % IV SOLN
2.0000 g | INTRAVENOUS | Status: DC
  Administered 2017-08-12: 2 g via INTRAVENOUS
  Filled 2017-08-12: qty 2

## 2017-08-12 MED ORDER — ONDANSETRON HCL 4 MG/2ML IJ SOLN: 4.0000 mg | Freq: Four times a day (QID) | INTRAMUSCULAR | Status: DC | PRN

## 2017-08-12 MED ORDER — ACETAMINOPHEN 650 MG RE SUPP: 650.0000 mg | Freq: Four times a day (QID) | RECTAL | Status: DC | PRN

## 2017-08-12 MED ORDER — METHYLPREDNISOLONE SODIUM SUCC 125 MG IJ SOLR
125.0000 mg | Freq: Once | INTRAMUSCULAR | Status: AC
  Administered 2017-08-12: 125 mg via INTRAVENOUS
  Filled 2017-08-12: qty 2

## 2017-08-12 MED ORDER — BUSPIRONE HCL 5 MG PO TABS
5.0000 mg | ORAL_TABLET | Freq: Two times a day (BID) | ORAL | Status: DC
  Administered 2017-08-12 – 2017-08-13 (×2): 5 mg via ORAL
  Filled 2017-08-12 (×5): qty 1

## 2017-08-12 NOTE — ED Notes (Signed)
Floor unable to take report at this time.

## 2017-08-12 NOTE — Progress Notes (Signed)
Chaplain met with patient to discuss the AD. Patient was familiar with the HCPOA and LW due to the illnesses/deaths of his parents and in-laws. After education by Chaplain, patient wished to speak with his wife and will have Chaplain paged if there is a desire to move forward.

## 2017-08-12 NOTE — Progress Notes (Signed)
Pharmacy Antibiotic Note  Bryan Montoya is a 62 y.o. male admitted on 08/12/2017 with pneumonia.  Pharmacy has been consulted for Ceftriaxone dosing.  Patient currently ordered Azithromycin  IV q24h and Ceftriaxone 2g IV q24h from ED.  Plan: Will continue Azithromycin  IV q24h. Will decrease to Ceftriaxone 1g IV q24h for CAP coverage.  Height: 5' 8.5" (174 cm) Weight: 155 lb 8 oz (70.5 kg) IBW/kg (Calculated) : 69.55  Temp (24hrs), Avg:99.3 F (37.4 C), Min:98.2 F (36.8 C), Max:100.4 F (38 C)  Recent Labs  Lab 08/12/17 1048  WBC 14.3*  CREATININE 0.72  LATICACIDVEN 1.1    Estimated Creatinine Clearance: 91.8 mL/min (by C-G formula based on SCr of 0.72 mg/dL).    Allergies  Allergen Reactions  . Hydrocodone Nausea And Vomiting  . Hydrocodone-Homatropine Nausea Only and Nausea And Vomiting    Antimicrobials this admission: Ceftriaxone 5/12 >>  Azithromycin 5/12 >>    Thank you for allowing pharmacy to be a part of this patient's care.  Clovia Cuff, PharmD, BCPS 08/12/2017 2:22 PM

## 2017-08-12 NOTE — H&P (Signed)
[facility] - [place] at [facility]   PATIENT NAME: Bryan Montoya    MR#:  [id number]  DATE OF BIRTH:  [birth year]  DATE OF ADMISSION:  08/12/2017  PRIMARY CARE PHYSICIAN: Mick Sell, MD   REQUESTING/REFERRING PHYSICIAN: dr Fredrich Romans   CHIEF COMPLAINT:   SOB and fever of 102 at home HISTORY OF PRESENT ILLNESS:  Bryan Montoya  is a 62 y.o. male with a known history of COPD and wears O2 on exertion and at night who presents to the emergency room today complaining shortness of breath and fever.  Patient reports for the past few days he has had increasing shortness of breath and productive cough.  He had a fever of 102 at home and family was concerned so brought him to the ER for further evaluation.  In the emergency room chest x-ray is consistent with community-acquired pneumonia.  He is started on Rocephin and azithromycin.  PAST MEDICAL HISTORY:   Past Medical History:  Diagnosis Date  . Arthritis   . COPD (chronic obstructive pulmonary disease) (HCC)   . GERD (gastroesophageal reflux disease)     PAST SURGICAL HISTORY:   Past Surgical History:  Procedure Laterality Date  . BACK SURGERY     x2    SOCIAL HISTORY:   Social History   Tobacco Use  . Smoking status: Former Smoker    Packs/day: 1.00    Years: 30.00    Pack years: 30.00    Types: Cigarettes    Last attempt to quit: 01/01/2006    Years since quitting: 11.6  . Smokeless tobacco: Never Used  . Tobacco comment: patient does not smoke  Substance Use Topics  . Alcohol use: Never    Frequency: Never    FAMILY HISTORY:  No family history on file.  DRUG ALLERGIES:   Allergies  Allergen Reactions  . Hydrocodone-Homatropine Nausea Only and Nausea And Vomiting    REVIEW OF SYSTEMS:   Review of Systems  Constitutional: Positive for fever and malaise/fatigue. Negative for chills.  HENT: Negative.  Negative for ear discharge, ear pain, hearing loss, nosebleeds and sore throat.   Eyes:  Negative.  Negative for blurred vision and pain.  Respiratory: Positive for cough, shortness of breath and wheezing. Negative for hemoptysis.   Cardiovascular: Negative.  Negative for chest pain, palpitations and leg swelling.  Gastrointestinal: Negative.  Negative for abdominal pain, blood in stool, diarrhea, nausea and vomiting.  Genitourinary: Negative.  Negative for dysuria.  Musculoskeletal: Negative.  Negative for back pain.  Skin: Negative.   Neurological: Negative for dizziness, tremors, speech change, focal weakness, seizures and headaches.  Endo/Heme/Allergies: Negative.  Does not bruise/bleed easily.  Psychiatric/Behavioral: Negative.  Negative for depression, hallucinations and suicidal ideas.    MEDICATIONS AT HOME:   Prior to Admission medications   Medication Sig Start Date End Date Taking? Authorizing Provider  acetaminophen (TYLENOL) 500 MG tablet Take 500 mg by mouth every 6 (six) hours as needed for headache.   Yes [provider]  albuterol (PROVENTIL HFA;VENTOLIN HFA) 108 (90 Base) MCG/ACT inhaler Inhale 2 puffs into the lungs every 6 (six) hours as needed.  12/06/16 08/13/18 Yes [provider]  busPIRone (BUSPAR) 10 MG tablet Take 5 mg by mouth 2 (two) times daily. 07/26/17  Yes [provider]  fluticasone (FLONASE) 50 MCG/ACT nasal spray Place 2 sprays into both nostrils daily as needed for allergies.    Yes [provider]  fluticasone furoate-vilanterol (BREO ELLIPTA) 100-25 MCG/INH AEPB Inhale  1 puff into the lungs daily.  12/06/16  Yes [provider]  gabapentin (NEURONTIN) 300 MG capsule Take 1-3 tablets nightly 09/01/16  Yes [provider]  loratadine (CLARITIN) 10 MG tablet Take 10 mg by mouth daily.    Yes [provider]  Melatonin 10 MG TABS Take 1 tablet by mouth at bedtime   Yes [provider]  meloxicam (MOBIC) 7.5 MG tablet Take 7.5 mg by mouth daily.  11/06/16 11/06/17 Yes [provider]  omeprazole (PRILOSEC) 20 MG capsule Take 20 mg by mouth daily.   Yes [provider]  predniSONE (DELTASONE) 10 MG tablet Take 10 mg by mouth daily. 08/03/17  Yes [provider]  Respiratory Therapy Supplies (FLUTTER) DEVI Use 1 each 3 (three) times daily. 05/24/16  Yes [provider]      VITAL SIGNS:  Blood pressure 120/74, pulse 92, temperature (!) 100.4 F (38 C), temperature source Oral, resp. rate (!) 27, height 5' 8.5" (1.74 m), weight 72.6 kg (160 lb), SpO2 99 %.  PHYSICAL EXAMINATION:   Physical Exam  Constitutional: He is oriented to person, place, and time. No distress.  HENT:  Head: Normocephalic.  Eyes: No scleral icterus.  Neck: Normal range of motion. Neck supple. No JVD present. No tracheal deviation present.  Cardiovascular: Normal rate, regular rhythm and normal heart sounds. Exam reveals no gallop and no friction rub.  No murmur heard. Pulmonary/Chest: Effort normal. No respiratory distress. He has decreased breath sounds in the right lower field. He has wheezes in the right lower field. He has no rales. He exhibits no tenderness.  Abdominal: Soft. Bowel sounds are normal. He exhibits no distension and no mass. There is no tenderness. There is no rebound and no guarding.  Musculoskeletal: Normal range of motion. He exhibits no edema.  Neurological: He is alert and oriented to person, place, and time.  Skin: Skin is warm. No rash noted. No erythema.  Psychiatric: Judgment normal.      LABORATORY PANEL:   CBC Recent Labs  Lab 08/12/17 1048  WBC 14.3*  HGB 14.1  HCT 40.3  PLT 211   ------------------------------------------------------------------------------------------------------------------  Chemistries  Recent Labs  Lab 08/12/17 1048  NA 135  K 4.1  CL 103  CO2 25  GLUCOSE 131*  BUN 16  CREATININE 0.72  CALCIUM 8.3*  AST 21  ALT 20  ALKPHOS 74  BILITOT 1.3*    ------------------------------------------------------------------------------------------------------------------  Cardiac Enzymes No results for input(s): TROPONINI in the last 168 hours. ------------------------------------------------------------------------------------------------------------------  RADIOLOGY:  Dg Chest Port 1 View  Result Date: 08/12/2017 CLINICAL DATA:  Former smoker, hx of COPD, cough with intermittent fever X 3 days EXAM: PORTABLE CHEST 1 VIEW COMPARISON:  11/30/2015, 07/25/2016 CT FINDINGS: Lungs are hyperinflated. There is patchy infiltrate in the LOWER lobes bilaterally, RIGHT greater than LEFT. Findings are consistent with infectious infiltrate or asymmetric pulmonary edema. IMPRESSION: 1. Hyperinflation. 2.  LOWER lobe infiltrates, RIGHT greater than LEFT. Electronically Signed   By: Norva Pavlov M.D.   On: 08/12/2017 11:28    EKG:  Normal sinus rhythm no ST elevation or depression  IMPRESSION AND PLAN:   62 year old male with COPD/chronic hypoxic respiratory failure who presents to the emergency room due to fever and shortness of breath.  1.  Sepsis: Patient presents with fever and leukocytosis.  Sepsis from community acquired pneumonia  2.  Community acquired pneumonia: Continue Rocephin and azithromycin and follow-up on blood cultures.  3.  Mild COPD exacerbation: Patient  is on daily prednisone for now I will start Solu-Medrol 60 mg IV daily. He follows with Dr. Meredeth Ide as an outpatient. Continue nebs and inhalers.  4.  GERD: Continue PPI  5.  Anxiety: Continue BuSpar    All the records are reviewed and case discussed with ED provider. Management plans discussed with the patient and he is in agreement  CODE STATUS: FULL  TOTAL TIME TAKING CARE OF THIS PATIENT: 40 minutes.    Rachyl Wuebker M.D on 08/12/2017 at 12:14 PM  Between 7am to 6pm - Pager - [phone]  After 6pm go to [email or web address] - Social research officer, government  [facility]  Office  [phone]  CC: Primary care physician; Mick Sell, MD

## 2017-08-12 NOTE — Progress Notes (Signed)
CODE SEPSIS - PHARMACY COMMUNICATION  **Broad Spectrum Antibiotics should be administered within 1 hour of Sepsis diagnosis**  Time Code Sepsis Called/Page Received: 11:10  Antibiotics Ordered: Ceftriaxone and Azithromycin at 11:10  Time of 1st antibiotic administration: Ceftriaxone given at 11:33  Additional action taken by pharmacy: n/a  If necessary, Name of Provider/Nurse Contacted: n/a   Clovia Cuff, PharmD, BCPS 08/12/2017 11:46 AM

## 2017-08-12 NOTE — Progress Notes (Signed)
Family Meeting Note  Advance Directive:no  Today a meeting took place with the Patient.and spouse  The following clinical team members were present during this meeting:MD  The following were discussed:Patient's diagnosis: Sepsis due to pneumonia, Patient's progosis: > 12 months and Goals for treatment: Full Code  Additional follow-up to be provided: Chaplain consult to start advanced directives  Time spent during discussion: 16 minutes  Bryan Mahaffy, MD

## 2017-08-12 NOTE — ED Provider Notes (Signed)
[facility] Emergency Department Provider Note  Time seen: 11:11 AM  I have reviewed the triage vital signs and the nursing notes.   HISTORY  Chief Complaint Shortness of Breath and Fever    HPI Bryan Montoya is a 62 y.o. male with a past medical history of COPD, gastric reflux, presents to the emergency department with cough, congestion and fever.  According to the patient for the past 1 week he has had increased shortness of breath and cough.  Has a history of COPD wears 2 L of oxygen at night and when needed.  Has been wearing it over the past for 5 days due to shortness of breath.  Patient also has an albuterol/nebulizer system at home has been giving himself breathing treatments every 6 hours or so but continues to feel short of breath.  Over the past 3 or 4 days has spiked fevers initially low-grade per wife but as high as 102.5 yesterday.  Patient has COPD currently on low-dose daily prednisone therapy.  Upon arrival patient satting 84% on room air, however sats in the mid 90s on 2 L.     Past Medical History:  Diagnosis Date  . Arthritis   . COPD (chronic obstructive pulmonary disease) (HCC)   . GERD (gastroesophageal reflux disease)     Patient Active Problem List   Diagnosis Date Noted  . Congenital spondylolisthesis of lumbosacral region 07/31/2016  . Degeneration of lumbar intervertebral disc 07/31/2016  . Bradycardia 07/20/2016  . Chronic systolic HF (heart failure) (HCC) 07/20/2016  . Back pain with left-sided sciatica 05/08/2016  . Centrilobular emphysema (HCC) 03/21/2016  . Chronic respiratory failure with hypoxia (HCC) 03/21/2016  . Benign essential HTN 01/31/2016  . Frequent PVCs 01/31/2016  . Hyperlipidemia, mixed 01/31/2016  . DOE (dyspnea on exertion) 12/07/2015  . GERD without esophagitis 08/17/2014  . COPD, moderate (HCC) 11/04/2013    Past Surgical History:  Procedure Laterality Date  . BACK SURGERY     x2    Prior to  Admission medications   Medication Sig Start Date End Date Taking? Authorizing Provider  albuterol (PROVENTIL HFA;VENTOLIN HFA) 108 (90 Base) MCG/ACT inhaler Inhale into the lungs. 12/06/16 01/05/17  [provider]  fluticasone (FLONASE) 50 MCG/ACT nasal spray Place into the nose.    [provider]  fluticasone furoate-vilanterol (BREO ELLIPTA) 100-25 MCG/INH AEPB Inhale into the lungs. 12/06/16   [provider]  gabapentin (NEURONTIN) 300 MG capsule Take 1-3 tablets nightly prn restless leg 09/01/16   [provider]  loratadine (CLARITIN) 10 MG tablet Take by mouth.    [provider]  Melatonin 5 MG TABS 10 mg. Take 1 tablet by mouth at bedtime as needed    [provider]  meloxicam (MOBIC) 7.5 MG tablet Take by mouth. 11/06/16 11/06/17  [provider]  Respiratory Therapy Supplies (FLUTTER) DEVI Use 1 each 3 (three) times daily. 05/24/16   [provider]  umeclidinium bromide (INCRUSE ELLIPTA) 62.5 MCG/INH AEPB Inhale into the lungs. 11/20/16   [provider]    Allergies  Allergen Reactions  . Hydrocodone-Homatropine Nausea Only and Nausea And Vomiting    No family history on file.  Social History Social History   Tobacco Use  . Smoking status: Former Smoker    Packs/day: 1.00    Years: 30.00    Pack years: 30.00    Types: Cigarettes    Last attempt to quit: 01/01/2006    Years since quitting: 11.6  .  Smokeless tobacco: Never Used  . Tobacco comment: patient does not smoke  Substance Use Topics  . Alcohol use: Never    Frequency: Never  . Drug use: Not on file    Review of Systems Constitutional: Fevers high as 102.5. Eyes: Negative for visual complaints ENT: Mild congestion Cardiovascular: Negative for chest pain. Respiratory: Shortness of breath, much worse with any attempted ambulation or exertion.  Positive for cough. Gastrointestinal: Negative for abdominal pain, vomiting.  Intermittent  loose stool. Genitourinary: Negative for urinary compaints Musculoskeletal: Negative for leg pain or swelling. Skin: Negative for skin complaints  Neurological: Negative for headache All other ROS negative  ____________________________________________   PHYSICAL EXAM:  VITAL SIGNS: ED Triage Vitals  Enc Vitals Group     BP 08/12/17 1044 129/86     Pulse Rate 08/12/17 1044 79     Resp 08/12/17 1044 (!) 26     Temp 08/12/17 1044 (!) 100.4 F (38 C)     Temp Source 08/12/17 1044 Oral     SpO2 08/12/17 1044 98 %     Weight 08/12/17 1034 160 lb (72.6 kg)     Height 08/12/17 1034 5' 8.5" (1.74 m)     Head Circumference --      Peak Flow --      Pain Score 08/12/17 1034 2     Pain Loc --      Pain Edu? --      Excl. in GC? --    Constitutional: Alert and oriented.  No acute distress, mild tachypnea. Eyes: Normal exam ENT   Head: Normocephalic and atraumatic.   Nose: Mild congestion   Mouth/Throat: Mucous membranes are moist. Cardiovascular: Normal rate, regular rhythm. No murmur Respiratory: Mild tachypnea, 26 breaths/min currently.  Mild expiratory wheeze but significantly diminished breath sounds bilaterally. Gastrointestinal: Soft and nontender. No distention.  Musculoskeletal: Nontender with normal range of motion in all extremities. No lower extremity tenderness or edema. Neurologic:  Normal speech and language. No gross focal neurologic deficits  Skin:  Skin is warm, dry and intact.  Psychiatric: Mood and affect are normal.   ____________________________________________    EKG  EKG reviewed and interpreted by myself shows normal sinus rhythm at 78 bpm with a narrow QRS, normal axis, normal intervals, nonspecific but no concerning ST changes.  ____________________________________________    RADIOLOGY  X-ray shows bilateral pneumonia right greater than left  ____________________________________________   INITIAL IMPRESSION / ASSESSMENT AND PLAN / ED  COURSE  Pertinent labs & imaging results that were available during my care of the patient were reviewed by me and considered in my medical decision making (see chart for details).  Patient presents to the emergency department for shortness of breath cough and fever.  Differential would include pneumonia, COPD exacerbation, upper respiratory infection, viral infection.  As the patient is febrile 100.4 with reported fevers as high as 102.5 at home along with moderate tachypnea, have initiated sepsis protocols for the patient.  We will cover with broad-spectrum antibiotics for community-acquired pneumonia, 125 mg of Solu-Medrol, duo nebs.  Will obtain a chest x-ray as well as labs and continue to closely monitor.  Patient agreeable to this plan of care.  X-ray consistent with bilateral pneumonia, white blood cell count of 14,000.  Given elevated white blood cell count, tachypnea, temperature with decreased room air saturation compared to baseline we will admit the patient to the hospitalist service for continued work-up and treatment of sepsis due to pneumonia.   CRITICAL CARE Performed  by: Minna Antis   Total critical care time: 30 minutes  Critical care time was exclusive of separately billable procedures and treating other patients.  Critical care was necessary to treat or prevent imminent or life-threatening deterioration.  Critical care was time spent personally by me on the following activities: development of treatment plan with patient and/or surrogate as well as nursing, discussions with consultants, evaluation of patient's response to treatment, examination of patient, obtaining history from patient or surrogate, ordering and performing treatments and interventions, ordering and review of laboratory studies, ordering and review of radiographic studies, pulse oximetry and re-evaluation of patient's condition.   ____________________________________________   FINAL CLINICAL  IMPRESSION(S) / ED DIAGNOSES  Fever COPD exacerbation Sepsis Pneumonia   Minna Antis, MD 08/12/17 1205

## 2017-08-12 NOTE — ED Triage Notes (Signed)
Patient has been short of breath since May 8th but developed a fever on Friday.  Patient had a fever of 102.6 this morning.  Patient has a congested cough.  Patient's oxygen level was 84% on room air at home but patient uses 2L oxygen as needed.  No tylenol or ibuprofen this morning.

## 2017-08-13 LAB — CBC
HEMATOCRIT: 38.4 % — AB (ref 40.0–52.0)
HEMOGLOBIN: 12.9 g/dL — AB (ref 13.0–18.0)
MCH: 31.1 pg (ref 26.0–34.0)
MCHC: 33.6 g/dL (ref 32.0–36.0)
MCV: 92.4 fL (ref 80.0–100.0)
Platelets: 221 10*3/uL (ref 150–440)
RBC: 4.16 MIL/uL — AB (ref 4.40–5.90)
RDW: 14.2 % (ref 11.5–14.5)
WBC: 12.6 10*3/uL — ABNORMAL HIGH (ref 3.8–10.6)

## 2017-08-13 LAB — BASIC METABOLIC PANEL
ANION GAP: 5 (ref 5–15)
BUN: 13 mg/dL (ref 6–20)
CO2: 25 mmol/L (ref 22–32)
Calcium: 8.4 mg/dL — ABNORMAL LOW (ref 8.9–10.3)
Chloride: 108 mmol/L (ref 101–111)
Creatinine, Ser: 0.63 mg/dL (ref 0.61–1.24)
GFR calc non Af Amer: >60 mL/min (ref >60)
GLUCOSE: 138 mg/dL — AB (ref 65–99)
POTASSIUM: 4.5 mmol/L (ref 3.5–5.1)
Sodium: 138 mmol/L (ref 135–145)

## 2017-08-13 MED ORDER — IPRATROPIUM-ALBUTEROL 0.5-2.5 (3) MG/3ML IN SOLN
3.0000 mL | RESPIRATORY_TRACT | Status: DC
  Administered 2017-08-13: 3 mL via RESPIRATORY_TRACT
  Filled 2017-08-13 (×2): qty 3

## 2017-08-13 MED ORDER — AZITHROMYCIN 500 MG PO TABS: 500.0000 mg | ORAL_TABLET | Freq: Every day | ORAL | 0 refills | Status: AC

## 2017-08-13 MED ORDER — GUAIFENESIN-DM 100-10 MG/5ML PO SYRP: 5.0000 mL | ORAL_SOLUTION | ORAL | 0 refills | Status: DC | PRN

## 2017-08-13 MED ORDER — CEFUROXIME AXETIL 500 MG PO TABS: 500.0000 mg | ORAL_TABLET | Freq: Two times a day (BID) | ORAL | 0 refills | Status: DC

## 2017-08-13 MED ORDER — PREDNISONE 10 MG PO TABS: 40.0000 mg | ORAL_TABLET | Freq: Every day | ORAL | 0 refills | Status: AC

## 2017-08-13 MED ORDER — GUAIFENESIN-DM 100-10 MG/5ML PO SYRP
5.0000 mL | ORAL_SOLUTION | ORAL | Status: DC | PRN
  Filled 2017-08-13: qty 5

## 2017-08-13 NOTE — Discharge Summary (Signed)
[facility] [place] at [facility]   PATIENT NAME: Bryan Montoya    MR#:  [id number]  DATE OF BIRTH:  1955-05-11  DATE OF ADMISSION:  08/12/2017 ADMITTING PHYSICIAN: Adrian Saran, MD  DATE OF DISCHARGE: 08/13/2017  PRIMARY CARE PHYSICIAN: Mick Sell, MD    ADMISSION DIAGNOSIS:  COPD exacerbation (HCC) [J44.1] Sepsis, due to unspecified organism (HCC) [A41.9] Community acquired pneumonia, unspecified laterality [J18.9]  DISCHARGE DIAGNOSIS:  Active Problems:   Sepsis (HCC)   SECONDARY DIAGNOSIS:   Past Medical History:  Diagnosis Date  . Arthritis   . COPD (chronic obstructive pulmonary disease) (HCC)   . GERD (gastroesophageal reflux disease)     HOSPITAL COURSE:   62 year old male with COPD/chronic hypoxic respiratory failure who presents to the emergency room due to fever and shortness of breath.  1.  Sepsis: Patient presented with fever and leukocytosis.  Sepsis was due to community acquired pneumonia.  Sepsis is resolved.2.  Community acquired pneumonia: He will be discharged on oral Ceftin and azithromycin.  3.  Mild COPD exacerbation: He will be on prednisone 40 mg daily then may resume 10 mg daily after being seen by his pulmonologist on Friday.  He will continue inhalers and nebulizer treatments.  4.  GERD: Continue PPI  5.  Anxiety: Continue BuSpar      DISCHARGE CONDITIONS AND DIET:   Stable Regular diet  CONSULTS OBTAINED:    DRUG ALLERGIES:   Allergies  Allergen Reactions  . Hydrocodone Nausea And Vomiting  . Hydrocodone-Homatropine Nausea Only and Nausea And Vomiting    DISCHARGE MEDICATIONS:   Allergies as of 08/13/2017      Reactions   Hydrocodone Nausea And Vomiting   Hydrocodone-homatropine Nausea Only, Nausea And Vomiting      Medication List    TAKE these medications   acetaminophen 500 MG tablet Commonly known as:  TYLENOL Take 500 mg by mouth every 6 (six) hours as needed for headache.    albuterol 108 (90 Base) MCG/ACT inhaler Commonly known as:  PROVENTIL HFA;VENTOLIN HFA Inhale 2 puffs into the lungs every 6 (six) hours as needed.   azithromycin 500 MG tablet Commonly known as:  ZITHROMAX Take 1 tablet (500 mg total) by mouth daily for 5 days. Take 1 tablet daily for 5 days.   busPIRone 10 MG tablet Commonly known as:  BUSPAR Take 5 mg by mouth 2 (two) times daily.   cefUROXime 500 MG tablet Commonly known as:  CEFTIN Take 1 tablet (500 mg total) by mouth 2 (two) times daily with a meal.   fluticasone 50 MCG/ACT nasal spray Commonly known as:  FLONASE Place 2 sprays into both nostrils daily as needed for allergies.   fluticasone furoate-vilanterol 100-25 MCG/INH Aepb Commonly known as:  BREO ELLIPTA Inhale 1 puff into the lungs daily.   FLUTTER Devi Use 1 each 3 (three) times daily.   gabapentin 300 MG capsule Commonly known as:  NEURONTIN Take 1-3 tablets nightly   guaiFENesin-dextromethorphan 100-10 MG/5ML syrup Commonly known as:  ROBITUSSIN DM Take 5 mLs by mouth every 4 (four) hours as needed for cough.   loratadine 10 MG tablet Commonly known as:  CLARITIN Take 10 mg by mouth daily.   Melatonin 10 MG Tabs Take 1 tablet by mouth at bedtime   meloxicam 7.5 MG tablet Commonly known as:  MOBIC Take 7.5 mg by mouth daily.   omeprazole 20 MG capsule Commonly known as:  PRILOSEC Take 20 mg by mouth daily.  predniSONE 10 MG tablet Commonly known as:  DELTASONE Take 4 tablets (40 mg total) by mouth daily with breakfast for 4 days. What changed:    how much to take  when to take this         Today   CHIEF COMPLAINT:  doing better this am  Would like to go home.  VITAL SIGNS:  Blood pressure 106/74, pulse (!) 55, temperature 97.9 F (36.6 C), temperature source Oral, resp. rate 15, height 5' 8.5" (1.74 m), weight 70.5 kg (155 lb 8 oz), SpO2 98 %.   REVIEW OF SYSTEMS:  Review of Systems  Constitutional: Negative.  Negative  for chills, fever and malaise/fatigue.  HENT: Negative.  Negative for ear discharge, ear pain, hearing loss, nosebleeds and sore throat.   Eyes: Negative.  Negative for blurred vision and pain.  Respiratory: Positive for cough. Negative for hemoptysis, shortness of breath and wheezing.   Cardiovascular: Negative.  Negative for chest pain, palpitations and leg swelling.  Gastrointestinal: Negative.  Negative for abdominal pain, blood in stool, diarrhea, nausea and vomiting.  Genitourinary: Negative.  Negative for dysuria.  Musculoskeletal: Negative.  Negative for back pain.  Skin: Negative.   Neurological: Negative for dizziness, tremors, speech change, focal weakness, seizures and headaches.  Endo/Heme/Allergies: Negative.  Does not bruise/bleed easily.  Psychiatric/Behavioral: Negative.  Negative for depression, hallucinations and suicidal ideas.     PHYSICAL EXAMINATION:  GENERAL:  62 y.o.-year-old patient lying in the bed with no acute distress.  NECK:  Supple, no jugular venous distention. No thyroid enlargement, no tenderness.  LUNGS: Normal breath sounds bilaterally, no wheezing, rales,rhonchi  No use of accessory muscles of respiration.  CARDIOVASCULAR: S1, S2 normal. No murmurs, rubs, or gallops.  ABDOMEN: Soft, non-tender, non-distended. Bowel sounds present. No organomegaly or mass.  EXTREMITIES: No pedal edema, cyanosis, or clubbing.  PSYCHIATRIC: The patient is alert and oriented x 3.  SKIN: No obvious rash, lesion, or ulcer.   DATA REVIEW:   CBC Recent Labs  Lab 08/13/17 0400  WBC 12.6*  HGB 12.9*  HCT 38.4*  PLT 221    Chemistries  Recent Labs  Lab 08/12/17 1048 08/13/17 0400  NA 135 138  K 4.1 4.5  CL 103 108  CO2 25 25  GLUCOSE 131* 138*  BUN 16 13  CREATININE 0.72 0.63  CALCIUM 8.3* 8.4*  AST 21  --   ALT 20  --   ALKPHOS 74  --   BILITOT 1.3*  --     Cardiac Enzymes Recent Labs  Lab 08/12/17 1048  TROPONINI 0.03*    Microbiology Results   @  RADIOLOGY:  Dg Chest Port 1 View  Result Date: 08/12/2017 CLINICAL DATA:  Former smoker, hx of COPD, cough with intermittent fever X 3 days EXAM: PORTABLE CHEST 1 VIEW COMPARISON:  11/30/2015, 07/25/2016 CT FINDINGS: Lungs are hyperinflated. There is patchy infiltrate in the LOWER lobes bilaterally, RIGHT greater than LEFT. Findings are consistent with infectious infiltrate or asymmetric pulmonary edema. IMPRESSION: 1. Hyperinflation. 2.  LOWER lobe infiltrates, RIGHT greater than LEFT. Electronically Signed   By: Norva Pavlov M.D.   On: 08/12/2017 11:28      Allergies as of 08/13/2017      Reactions   Hydrocodone Nausea And Vomiting   Hydrocodone-homatropine Nausea Only, Nausea And Vomiting      Medication List    TAKE these medications   acetaminophen 500 MG tablet Commonly known as:  TYLENOL Take 500 mg by mouth every 6 (  six) hours as needed for headache.   albuterol 108 (90 Base) MCG/ACT inhaler Commonly known as:  PROVENTIL HFA;VENTOLIN HFA Inhale 2 puffs into the lungs every 6 (six) hours as needed.   azithromycin 500 MG tablet Commonly known as:  ZITHROMAX Take 1 tablet (500 mg total) by mouth daily for 5 days. Take 1 tablet daily for 5 days.   busPIRone 10 MG tablet Commonly known as:  BUSPAR Take 5 mg by mouth 2 (two) times daily.   cefUROXime 500 MG tablet Commonly known as:  CEFTIN Take 1 tablet (500 mg total) by mouth 2 (two) times daily with a meal.   fluticasone 50 MCG/ACT nasal spray Commonly known as:  FLONASE Place 2 sprays into both nostrils daily as needed for allergies.   fluticasone furoate-vilanterol 100-25 MCG/INH Aepb Commonly known as:  BREO ELLIPTA Inhale 1 puff into the lungs daily.   FLUTTER Devi Use 1 each 3 (three) times daily.   gabapentin 300 MG capsule Commonly known as:  NEURONTIN Take 1-3 tablets nightly   guaiFENesin-dextromethorphan 100-10 MG/5ML syrup Commonly known as:  ROBITUSSIN DM Take 5 mLs by mouth  every 4 (four) hours as needed for cough.   loratadine 10 MG tablet Commonly known as:  CLARITIN Take 10 mg by mouth daily.   Melatonin 10 MG Tabs Take 1 tablet by mouth at bedtime   meloxicam 7.5 MG tablet Commonly known as:  MOBIC Take 7.5 mg by mouth daily.   omeprazole 20 MG capsule Commonly known as:  PRILOSEC Take 20 mg by mouth daily.   predniSONE 10 MG tablet Commonly known as:  DELTASONE Take 4 tablets (40 mg total) by mouth daily with breakfast for 4 days. What changed:    how much to take  when to take this          Management plans discussed with the patient and he is in agreement. Stable for discharge home  Patient should follow up with pcp  CODE STATUS:     Code Status Orders  (From admission, onward)        Start     Ordered   08/12/17 1317  Full code  Continuous     08/12/17 1316    Code Status History    This patient has a current code status but no historical code status.      TOTAL TIME TAKING CARE OF THIS PATIENT: 38 minutes.    Note: This dictation was prepared with Dragon dictation along with smaller phrase technology. Any transcriptional errors that result from this process are unintentional.  Joyce Heitman M.D on 08/13/2017 at 9:45 AM  Between 7am to 6pm - Pager - [phone] After 6pm go to [email or web address] - Social research officer, government  [facility]  Office  [phone]  CC: Primary care physician; Mick Sell, MD

## 2017-08-13 NOTE — Progress Notes (Signed)
Discharge instructions along with home medications and follow up gone over with patient and wife. Both verbalize that they understood instructions. All prescriptions given to patient. IV's removed. Pt being discharged home on 2L 02, no distress noted. Otilio Jefferson, RN

## 2017-08-14 LAB — HIV ANTIBODY (ROUTINE TESTING W REFLEX): HIV Screen 4th Generation wRfx: NONREACTIVE

## 2017-08-17 LAB — CULTURE, BLOOD (ROUTINE X 2)
Culture: NO GROWTH
Culture: NO GROWTH
SPECIAL REQUESTS: ADEQUATE
SPECIAL REQUESTS: ADEQUATE

## 2019-04-12 ENCOUNTER — Other Ambulatory Visit: Payer: Self-pay

## 2019-04-12 ENCOUNTER — Emergency Department: Payer: Medicare Other

## 2019-04-12 DIAGNOSIS — J9601 Acute respiratory failure with hypoxia: Secondary | ICD-10-CM | POA: Diagnosis not present

## 2019-04-12 DIAGNOSIS — I5022 Chronic systolic (congestive) heart failure: Secondary | ICD-10-CM | POA: Diagnosis present

## 2019-04-12 DIAGNOSIS — J1282 Pneumonia due to coronavirus disease 2019: Secondary | ICD-10-CM | POA: Diagnosis present

## 2019-04-12 DIAGNOSIS — Z885 Allergy status to narcotic agent status: Secondary | ICD-10-CM

## 2019-04-12 DIAGNOSIS — J432 Centrilobular emphysema: Secondary | ICD-10-CM | POA: Diagnosis present

## 2019-04-12 DIAGNOSIS — U071 COVID-19: Principal | ICD-10-CM | POA: Diagnosis present

## 2019-04-12 DIAGNOSIS — Z87891 Personal history of nicotine dependence: Secondary | ICD-10-CM

## 2019-04-12 DIAGNOSIS — J9621 Acute and chronic respiratory failure with hypoxia: Secondary | ICD-10-CM | POA: Diagnosis present

## 2019-04-12 DIAGNOSIS — Z79899 Other long term (current) drug therapy: Secondary | ICD-10-CM

## 2019-04-12 DIAGNOSIS — K219 Gastro-esophageal reflux disease without esophagitis: Secondary | ICD-10-CM | POA: Diagnosis present

## 2019-04-12 DIAGNOSIS — Z9981 Dependence on supplemental oxygen: Secondary | ICD-10-CM

## 2019-04-12 DIAGNOSIS — Z7951 Long term (current) use of inhaled steroids: Secondary | ICD-10-CM

## 2019-04-12 LAB — CBC
HCT: 40.8 % (ref 39.0–52.0)
Hemoglobin: 13.4 g/dL (ref 13.0–17.0)
MCH: 29.5 pg (ref 26.0–34.0)
MCHC: 32.8 g/dL (ref 30.0–36.0)
MCV: 89.9 fL (ref 80.0–100.0)
Platelets: 178 10*3/uL (ref 150–400)
RBC: 4.54 MIL/uL (ref 4.22–5.81)
RDW: 13.8 % (ref 11.5–15.5)
WBC: 7.9 10*3/uL (ref 4.0–10.5)
nRBC: 0 % (ref 0.0–0.2)

## 2019-04-12 LAB — BASIC METABOLIC PANEL
Anion gap: 11 (ref 5–15)
BUN: 19 mg/dL (ref 8–23)
CO2: 23 mmol/L (ref 22–32)
Calcium: 8.6 mg/dL — ABNORMAL LOW (ref 8.9–10.3)
Chloride: 104 mmol/L (ref 98–111)
Creatinine, Ser: 0.92 mg/dL (ref 0.61–1.24)
GFR calc Af Amer: >60 mL/min (ref >60)
GFR calc non Af Amer: >60 mL/min (ref >60)
Glucose, Bld: 108 mg/dL — ABNORMAL HIGH (ref 70–99)
Potassium: 4 mmol/L (ref 3.5–5.1)
Sodium: 138 mmol/L (ref 135–145)

## 2019-04-12 NOTE — ED Triage Notes (Signed)
Patient reports fever (102.4 highest at home) with shortness of breath.  Patient wears O2 at night and as needed during the day.  Patient with history of COPD.

## 2019-04-12 NOTE — ED Notes (Signed)
Please call wife Aydian Dimmick with updates [phone].

## 2019-04-13 ENCOUNTER — Other Ambulatory Visit: Payer: Self-pay

## 2019-04-13 ENCOUNTER — Inpatient Hospital Stay
Admission: EM | Admit: 2019-04-13 | Discharge: 2019-04-16 | DRG: 177 | Disposition: A | Discharge status: Home / Self Care | Payer: Medicare Other | Attending: Internal Medicine | Admitting: Internal Medicine

## 2019-04-13 ENCOUNTER — Encounter: Payer: Self-pay | Admitting: Internal Medicine

## 2019-04-13 DIAGNOSIS — U071 COVID-19: Secondary | ICD-10-CM

## 2019-04-13 DIAGNOSIS — J1282 Pneumonia due to coronavirus disease 2019: Secondary | ICD-10-CM

## 2019-04-13 DIAGNOSIS — J440 Chronic obstructive pulmonary disease with acute lower respiratory infection: Secondary | ICD-10-CM | POA: Diagnosis present

## 2019-04-13 DIAGNOSIS — I5022 Chronic systolic (congestive) heart failure: Secondary | ICD-10-CM

## 2019-04-13 DIAGNOSIS — J9601 Acute respiratory failure with hypoxia: Secondary | ICD-10-CM | POA: Diagnosis present

## 2019-04-13 DIAGNOSIS — Z7951 Long term (current) use of inhaled steroids: Secondary | ICD-10-CM | POA: Diagnosis not present

## 2019-04-13 DIAGNOSIS — Z79899 Other long term (current) drug therapy: Secondary | ICD-10-CM | POA: Diagnosis not present

## 2019-04-13 DIAGNOSIS — K219 Gastro-esophageal reflux disease without esophagitis: Secondary | ICD-10-CM | POA: Diagnosis present

## 2019-04-13 DIAGNOSIS — Z87891 Personal history of nicotine dependence: Secondary | ICD-10-CM | POA: Diagnosis not present

## 2019-04-13 DIAGNOSIS — J9621 Acute and chronic respiratory failure with hypoxia: Secondary | ICD-10-CM | POA: Diagnosis present

## 2019-04-13 DIAGNOSIS — Z885 Allergy status to narcotic agent status: Secondary | ICD-10-CM | POA: Diagnosis not present

## 2019-04-13 DIAGNOSIS — J449 Chronic obstructive pulmonary disease, unspecified: Secondary | ICD-10-CM

## 2019-04-13 DIAGNOSIS — Z7984 Long term (current) use of oral hypoglycemic drugs: Secondary | ICD-10-CM | POA: Diagnosis not present

## 2019-04-13 LAB — CBC WITH DIFFERENTIAL/PLATELET
Abs Immature Granulocytes: 0.02 10*3/uL (ref 0.00–0.07)
Basophils Absolute: 0 10*3/uL (ref 0.0–0.1)
Basophils Relative: 0 %
Eosinophils Absolute: 0 10*3/uL (ref 0.0–0.5)
Eosinophils Relative: 0 %
HCT: 43.7 % (ref 39.0–52.0)
Hemoglobin: 14.3 g/dL (ref 13.0–17.0)
Immature Granulocytes: 0 %
Lymphocytes Relative: 17 %
Lymphs Abs: 1.1 10*3/uL (ref 0.7–4.0)
MCH: 29.9 pg (ref 26.0–34.0)
MCHC: 32.7 g/dL (ref 30.0–36.0)
MCV: 91.2 fL (ref 80.0–100.0)
Monocytes Absolute: 0.7 10*3/uL (ref 0.1–1.0)
Monocytes Relative: 11 %
Neutro Abs: 4.9 10*3/uL (ref 1.7–7.7)
Neutrophils Relative %: 72 %
Platelets: 171 10*3/uL (ref 150–400)
RBC: 4.79 MIL/uL (ref 4.22–5.81)
RDW: 13.8 % (ref 11.5–15.5)
WBC: 6.7 10*3/uL (ref 4.0–10.5)
nRBC: 0 % (ref 0.0–0.2)

## 2019-04-13 LAB — URINALYSIS, COMPLETE (UACMP) WITH MICROSCOPIC
Bacteria, UA: NONE SEEN
Bilirubin Urine: NEGATIVE
Glucose, UA: NEGATIVE mg/dL
Hgb urine dipstick: NEGATIVE
Ketones, ur: NEGATIVE mg/dL
Leukocytes,Ua: NEGATIVE
Nitrite: NEGATIVE
Protein, ur: NEGATIVE mg/dL
Specific Gravity, Urine: 1.021 (ref 1.005–1.030)
Squamous Epithelial / LPF: NONE SEEN (ref 0–5)
pH: 5 (ref 5.0–8.0)

## 2019-04-13 LAB — TRIGLYCERIDES: Triglycerides: 81 mg/dL (ref <150)

## 2019-04-13 LAB — C-REACTIVE PROTEIN: CRP: 0.7 mg/dL (ref <1.0)

## 2019-04-13 LAB — PROCALCITONIN: Procalcitonin: <0.1 ng/mL

## 2019-04-13 LAB — LACTATE DEHYDROGENASE: LDH: 174 U/L (ref 98–192)

## 2019-04-13 LAB — FERRITIN: Ferritin: 170 ng/mL (ref 24–336)

## 2019-04-13 LAB — FIBRIN DERIVATIVES D-DIMER (ARMC ONLY): Fibrin derivatives D-dimer (ARMC): 508.34 ng/mL (FEU) — ABNORMAL HIGH (ref 0.00–499.00)

## 2019-04-13 LAB — FIBRINOGEN: Fibrinogen: 462 mg/dL (ref 210–475)

## 2019-04-13 LAB — LACTIC ACID, PLASMA: Lactic Acid, Venous: 1.1 mmol/L (ref 0.5–1.9)

## 2019-04-13 MED ORDER — TIZANIDINE HCL 4 MG PO TABS
4.0000 mg | ORAL_TABLET | Freq: Three times a day (TID) | ORAL | Status: DC | PRN
  Filled 2019-04-13: qty 1

## 2019-04-13 MED ORDER — SODIUM CHLORIDE 0.9 % IV SOLN
200.0000 mg | Freq: Once | INTRAVENOUS | Status: AC
  Administered 2019-04-13: 200 mg via INTRAVENOUS
  Filled 2019-04-13: qty 200

## 2019-04-13 MED ORDER — BUSPIRONE HCL 5 MG PO TABS
5.0000 mg | ORAL_TABLET | Freq: Two times a day (BID) | ORAL | Status: DC
  Administered 2019-04-13 – 2019-04-16 (×6): 5 mg via ORAL
  Filled 2019-04-13 (×8): qty 1

## 2019-04-13 MED ORDER — POLYETHYLENE GLYCOL 3350 17 G PO PACK: 17.0000 g | PACK | Freq: Every day | ORAL | Status: DC | PRN

## 2019-04-13 MED ORDER — ACETAMINOPHEN 325 MG PO TABS: 650.0000 mg | ORAL_TABLET | Freq: Four times a day (QID) | ORAL | Status: DC | PRN

## 2019-04-13 MED ORDER — FLUTICASONE FUROATE-VILANTEROL 100-25 MCG/INH IN AEPB
1.0000 | INHALATION_SPRAY | Freq: Every day | RESPIRATORY_TRACT | Status: DC
  Administered 2019-04-14: 1 via RESPIRATORY_TRACT
  Filled 2019-04-13 (×2): qty 28

## 2019-04-13 MED ORDER — DEXAMETHASONE SODIUM PHOSPHATE 10 MG/ML IJ SOLN
10.0000 mg | Freq: Once | INTRAMUSCULAR | Status: AC
  Administered 2019-04-13: 10 mg via INTRAVENOUS
  Filled 2019-04-13: qty 1

## 2019-04-13 MED ORDER — SODIUM CHLORIDE 0.9 % IV SOLN
100.0000 mg | Freq: Every day | INTRAVENOUS | Status: DC
  Administered 2019-04-14 – 2019-04-16 (×3): 100 mg via INTRAVENOUS
  Filled 2019-04-13 (×4): qty 20

## 2019-04-13 MED ORDER — GUAIFENESIN-DM 100-10 MG/5ML PO SYRP
5.0000 mL | ORAL_SOLUTION | ORAL | Status: DC | PRN
  Filled 2019-04-13: qty 5

## 2019-04-13 MED ORDER — ONDANSETRON HCL 4 MG/2ML IJ SOLN: 4.0000 mg | Freq: Four times a day (QID) | INTRAMUSCULAR | Status: DC | PRN

## 2019-04-13 MED ORDER — DOXYCYCLINE HYCLATE 100 MG PO TABS
100.0000 mg | ORAL_TABLET | Freq: Two times a day (BID) | ORAL | Status: DC
  Administered 2019-04-13 – 2019-04-14 (×2): 100 mg via ORAL
  Filled 2019-04-13 (×2): qty 1

## 2019-04-13 MED ORDER — PANTOPRAZOLE SODIUM 40 MG PO TBEC
40.0000 mg | DELAYED_RELEASE_TABLET | Freq: Every day | ORAL | Status: DC
  Administered 2019-04-13 – 2019-04-16 (×4): 40 mg via ORAL
  Filled 2019-04-13 (×4): qty 1

## 2019-04-13 MED ORDER — LORATADINE 10 MG PO TABS
10.0000 mg | ORAL_TABLET | Freq: Every day | ORAL | Status: DC
  Administered 2019-04-13 – 2019-04-16 (×4): 10 mg via ORAL
  Filled 2019-04-13 (×4): qty 1

## 2019-04-13 MED ORDER — MELATONIN 5 MG PO TABS
1.0000 | ORAL_TABLET | Freq: Every day | ORAL | Status: DC
  Administered 2019-04-13 – 2019-04-15 (×3): 5 mg via ORAL
  Filled 2019-04-13 (×5): qty 1

## 2019-04-13 MED ORDER — DEXAMETHASONE 4 MG PO TABS
6.0000 mg | ORAL_TABLET | ORAL | Status: DC
  Administered 2019-04-14 – 2019-04-16 (×3): 6 mg via ORAL
  Filled 2019-04-13 (×2): qty 2
  Filled 2019-04-13: qty 1.5
  Filled 2019-04-13: qty 2

## 2019-04-13 MED ORDER — GABAPENTIN 300 MG PO CAPS
300.0000 mg | ORAL_CAPSULE | Freq: Every day | ORAL | Status: DC
  Administered 2019-04-13 – 2019-04-15 (×3): 300 mg via ORAL
  Filled 2019-04-13 (×3): qty 1

## 2019-04-13 MED ORDER — ONDANSETRON HCL 4 MG PO TABS: 4.0000 mg | ORAL_TABLET | Freq: Four times a day (QID) | ORAL | Status: DC | PRN

## 2019-04-13 MED ORDER — ENOXAPARIN SODIUM 40 MG/0.4ML ~~LOC~~ SOLN
40.0000 mg | SUBCUTANEOUS | Status: DC
  Administered 2019-04-13 – 2019-04-15 (×3): 40 mg via SUBCUTANEOUS
  Filled 2019-04-13 (×3): qty 0.4

## 2019-04-13 MED ORDER — ALBUTEROL SULFATE HFA 108 (90 BASE) MCG/ACT IN AERS
2.0000 | INHALATION_SPRAY | Freq: Four times a day (QID) | RESPIRATORY_TRACT | Status: DC | PRN
  Filled 2019-04-13: qty 6.7

## 2019-04-13 NOTE — ED Notes (Signed)
Pt's wife to come pick up personal oxygen machine- it was placed in a pt belongings bag with pt sticker on it and left at the front desk- Val, RN and front desk security aware

## 2019-04-13 NOTE — H&P (Addendum)
History and Physical:    Bryan Montoya   NUU:[id number] DOB: [birth year] DOA: 04/13/2019  Referring MD/provider: Dr. Kerman Passey PCP: Leonel Ramsay, MD   Patient coming from: Home  Chief Complaint: Shortness of breath  History of Present Illness:   Bryan Montoya is an 64 y.o. male with medical history significant for COPD, chronic systolic CHF (EF was estimated at 45% on echo in October 2017) chronic hypoxemic respiratory failure on 2 L of oxygen, arthritis, GERD, who presented to the hospital because of fever and shortness of breath.  He said his temperature went up to 102.4 yesterday.  He noticed that his oxygen saturation dropped into the 70s even while on 2 L of oxygen.  His heart rate also went up into the 120s with mild activity.  Shortness of breath was particularly worse with exertion.  He is also heard at cough, mostly dry.  He feels very tired and weak.  No vomiting or diarrhea.  He was seen recently at his doctor's office (January 2021) for COPD exacerbation and prescribed prednisone and doxycycline.  ED Course: He was requiring 4 L/min oxygen otherwise the patient had normal vital signs on presentation to the emergency room.  X-ray did not show any acute abnormality but patient like he has pneumonia from COVID-19 infection that is not showing up on x-ray.  He tested positive for coronavirus infection and he was started on Tessalon and remdesivir.  ROS:   ROS all other systems reviewed were negative  Past Medical History:   Past Medical History:  Diagnosis Date  . Arthritis   . COPD (chronic obstructive pulmonary disease) ([place])   . GERD (gastroesophageal reflux disease)     Past Surgical History:   Past Surgical History:  Procedure Laterality Date  . BACK SURGERY     x2    Social History:   Social History   Socioeconomic History  . Marital status: Married    Spouse name: Not on file  . Number of children: Not on file  . Years of education: Not on file   . Highest education level: Not on file  Occupational History  . Not on file  Tobacco Use  . Smoking status: Former Smoker    Packs/day: 1.00    Years: 30.00    Pack years: 30.00    Types: Cigarettes    Quit date: 01/01/2006    Years since quitting: 13.2  . Smokeless tobacco: Never Used  . Tobacco comment: patient does not smoke  Substance and Sexual Activity  . Alcohol use: Never  . Drug use: Not on file  . Sexual activity: Not on file  Other Topics Concern  . Not on file  Social History Narrative  . Not on file   Social Determinants of Health   Financial Resource Strain:   . Difficulty of Paying Living Expenses: Not on file  Food Insecurity:   . Worried About Charity fundraiser in the Last Year: Not on file  . Ran Out of Food in the Last Year: Not on file  Transportation Needs:   . Lack of Transportation (Medical): Not on file  . Lack of Transportation (Non-Medical): Not on file  Physical Activity:   . Days of Exercise per Week: Not on file  . Minutes of Exercise per Session: Not on file  Stress:   . Feeling of Stress : Not on file  Social Connections:   . Frequency of Communication with Friends and Family: Not on file  .  Frequency of Social Gatherings with Friends and Family: Not on file  . Attends Religious Services: Not on file  . Active Member of Clubs or Organizations: Not on file  . Attends Banker Meetings: Not on file  . Marital Status: Not on file  Intimate Partner Violence:   . Fear of Current or Ex-Partner: Not on file  . Emotionally Abused: Not on file  . Physically Abused: Not on file  . Sexually Abused: Not on file    Allergies   Hydrocodone and Hydrocodone-homatropine  Family history:   History reviewed. No pertinent family history.  Current Medications:   Prior to Admission medications   Medication Sig Start Date End Date Taking? Authorizing Provider  acetaminophen (TYLENOL) 500 MG tablet Take 500 mg by mouth every 6 (six)  hours as needed for headache.    [provider]  albuterol (PROVENTIL HFA;VENTOLIN HFA) 108 (90 Base) MCG/ACT inhaler Inhale 2 puffs into the lungs every 6 (six) hours as needed.  12/06/16 08/13/18  [provider]  busPIRone (BUSPAR) 10 MG tablet Take 5 mg by mouth 2 (two) times daily. 07/26/17   [provider]  cefUROXime (CEFTIN) 500 MG tablet Take 1 tablet (500 mg total) by mouth 2 (two) times daily with a meal. 08/13/17   Adrian Saran, MD  fluticasone (FLONASE) 50 MCG/ACT nasal spray Place 2 sprays into both nostrils daily as needed for allergies.     [provider]  fluticasone furoate-vilanterol (BREO ELLIPTA) 100-25 MCG/INH AEPB Inhale 1 puff into the lungs daily.  12/06/16   [provider]  gabapentin (NEURONTIN) 300 MG capsule Take 1-3 tablets nightly 09/01/16   [provider]  guaiFENesin-dextromethorphan (ROBITUSSIN DM) 100-10 MG/5ML syrup Take 5 mLs by mouth every 4 (four) hours as needed for cough. 08/13/17   Adrian Saran, MD  loratadine (CLARITIN) 10 MG tablet Take 10 mg by mouth daily.     [provider]  Melatonin 10 MG TABS Take 1 tablet by mouth at bedtime    [provider]  omeprazole (PRILOSEC) 20 MG capsule Take 20 mg by mouth daily.    [provider]  Respiratory Therapy Supplies (FLUTTER) DEVI Use 1 each 3 (three) times daily. 05/24/16   [provider]    Physical Exam:   Vitals:   04/13/19 0545 04/13/19 0600 04/13/19 0700 04/13/19 0730  BP: 140/77 129/84 135/78 (!) 141/78  Pulse: (!) 58 (!) 59 (!) 58 (!) 56  Resp: 12 13 15 17   Temp:      TempSrc:      SpO2: 100% 100% 100% 100%  Weight:      Height:         Physical Exam: Blood pressure (!) 141/78, pulse (!) 56, temperature 99.5 F (37.5 C), temperature source Oral, resp. rate 17, height 5\' 8"  (1.727 m), weight 68 kg, SpO2 100 %. Gen: No acute distress. Head: Normocephalic, atraumatic. Eyes: Pupils equal, round and reactive  to light. Extraocular movements intact.  Sclerae nonicteric.  Mouth: Dry mucous membranes Neck: Supple, no thyromegaly, no lymphadenopathy, no jugular venous distention. Chest: Lungs are clear to auscultation with good air movement. No rales, rhonchi or wheezes.  CV: Heart sounds are regular with an S1, S2. No murmurs, rubs or gallops.  Abdomen: Soft, nontender, nondistended with normal active bowel sounds. No palpable masses. Extremities: Extremities are without clubbing, or cyanosis. No edema. Pedal pulses 2+.  Skin: Warm and dry. No rashes, lesions or wounds Neuro: Alert and  oriented times 3; grossly nonfocal.  Psych: Insight is good and judgment is appropriate. Mood and affect normal.   Data Review:    Labs: Basic Metabolic Panel: Recent Labs  Lab 04/12/19 2310  NA 138  K 4.0  CL 104  CO2 23  GLUCOSE 108*  BUN 19  CREATININE 0.92  CALCIUM 8.6*   Liver Function Tests: No results for input(s): AST, ALT, ALKPHOS, BILITOT, PROT, ALBUMIN in the last 168 hours. No results for input(s): LIPASE, AMYLASE in the last 168 hours. No results for input(s): AMMONIA in the last 168 hours. CBC: Recent Labs  Lab 04/12/19 2310 04/13/19 0543  WBC 7.9 6.7  NEUTROABS  --  4.9  HGB 13.4 14.3  HCT 40.8 43.7  MCV 89.9 91.2  PLT 178 171   Cardiac Enzymes: No results for input(s): CKTOTAL, CKMB, CKMBINDEX, TROPONINI in the last 168 hours.  BNP (last 3 results) No results for input(s): PROBNP in the last 8760 hours. CBG: No results for input(s): GLUCAP in the last 168 hours.  Urinalysis    Component Value Date/Time   COLORURINE YELLOW (A) 04/13/2019 0542   APPEARANCEUR HAZY (A) 04/13/2019 0542   LABSPEC 1.021 04/13/2019 0542   PHURINE 5.0 04/13/2019 0542   GLUCOSEU NEGATIVE 04/13/2019 0542   HGBUR NEGATIVE 04/13/2019 0542   BILIRUBINUR NEGATIVE 04/13/2019 0542   KETONESUR NEGATIVE 04/13/2019 0542   PROTEINUR NEGATIVE 04/13/2019 0542   NITRITE NEGATIVE 04/13/2019 0542    LEUKOCYTESUR NEGATIVE 04/13/2019 0542      Radiographic Studies: DG Chest 2 View  Result Date: 04/12/2019 CLINICAL DATA:  Increased shortness of breath EXAM: CHEST - 2 VIEW COMPARISON:  Aug 12, 2017 FINDINGS: The heart size and mediastinal contours are unchanged. There is hyperinflation of the upper lung zones with flattening of the hemidiaphragms. No new airspace consolidation or pleural effusion. IMPRESSION: Findings of chronic COPD.  No acute cardiopulmonary process. Electronically Signed   By: Jonna Clark M.D.   On: 04/12/2019 23:30    EKG: Independently reviewed.  Normal sinus rhythm   Assessment/Plan:   Principal Problem:   Pneumonia due to COVID-19 virus Active Problems:   Chronic systolic HF (heart failure) (HCC)   COPD, moderate (HCC)   Acute respiratory failure with hypoxemia (HCC)   Body mass index is 22.81 kg/m.   Acute on chronic hypoxemic respiratory failure: Treat with oxygen via nasal cannula.  He is requiring 4 L/min oxygen  Covid 19 infection with pneumonia: Treat with IV remdesivir and dexamethasone  COPD with recent exacerbation: Compensated.  Continue bronchodilators and doxycycline  Underlying chronic systolic CHF: Compensated   Other information:   DVT prophylaxis: Lovenox Code Status: Full code. Family Communication: Plan discussed with the patient Disposition Plan: Possible discharge to home in 2 to 3 days Consults called: None Admission status: Inpatient    The medical decision making is of moderate complexity, therefore this is a level 2 visit.  Time spent 48 minutes  Rye Decoste [facility]   How to contact the [facility] Attending or Consulting provider 7A - 7P or covering provider during after hours 7P -7A, for this patient?   1. Check the care team in [facility] and look for a) attending/consulting [facility] provider listed and b) the [facility] team listed 2. Log into [email or web address] and use [place]'s universal password to access. If you do  not have the password, please contact the hospital operator. 3. Locate the [facility] provider you are looking for under [facility] and page to a number  that you can be directly reached. 4. If you still have difficulty reaching the provider, please page the [facility] (Director on Call) for the Hospitalists listed on amion for assistance.  04/13/2019, 9:58 AM

## 2019-04-13 NOTE — ED Provider Notes (Signed)
[facility] Emergency Department Provider Note  ____________________________________________   First MD Initiated Contact with Patient 04/13/19 [phone]     (approximate)  I have reviewed the triage vital signs and the nursing notes.   HISTORY  Chief Complaint Fever    HPI Bryan Montoya is a 64 y.o. male with below list of previous medical conditions including COPD and COVID-19 infection diagnosed yesterday at her noted clinic presents to the emergency department secondary to fever T-max 102.4 at home and progressive dyspnea with increasing oxygen requirement.  Patient is on 2 L at night and states that his oxygen saturation was in the 70s on baseline 2 L.  Patient does admit to dyspnea and cough.        Past Medical History:  Diagnosis Date  . Arthritis   . COPD (chronic obstructive pulmonary disease) (HCC)   . GERD (gastroesophageal reflux disease)     Patient Active Problem List   Diagnosis Date Noted  . Sepsis (HCC) 08/12/2017  . Congenital spondylolisthesis of lumbosacral region 07/31/2016  . Degeneration of lumbar intervertebral disc 07/31/2016  . Bradycardia 07/20/2016  . Chronic systolic HF (heart failure) (HCC) 07/20/2016  . Back pain with left-sided sciatica 05/08/2016  . Centrilobular emphysema (HCC) 03/21/2016  . Chronic respiratory failure with hypoxia (HCC) 03/21/2016  . Benign essential HTN 01/31/2016  . Frequent PVCs 01/31/2016  . Hyperlipidemia, mixed 01/31/2016  . DOE (dyspnea on exertion) 12/07/2015  . GERD without esophagitis 08/17/2014  . COPD, moderate (HCC) 11/04/2013    Past Surgical History:  Procedure Laterality Date  . BACK SURGERY     x2    Prior to Admission medications   Medication Sig Start Date End Date Taking? Authorizing Provider  acetaminophen (TYLENOL) 500 MG tablet Take 500 mg by mouth every 6 (six) hours as needed for headache.    [provider]  albuterol (PROVENTIL HFA;VENTOLIN HFA) 108  (90 Base) MCG/ACT inhaler Inhale 2 puffs into the lungs every 6 (six) hours as needed.  12/06/16 08/13/18  [provider]  busPIRone (BUSPAR) 10 MG tablet Take 5 mg by mouth 2 (two) times daily. 07/26/17   [provider]  cefUROXime (CEFTIN) 500 MG tablet Take 1 tablet (500 mg total) by mouth 2 (two) times daily with a meal. 08/13/17   Adrian Saran, MD  fluticasone (FLONASE) 50 MCG/ACT nasal spray Place 2 sprays into both nostrils daily as needed for allergies.     [provider]  fluticasone furoate-vilanterol (BREO ELLIPTA) 100-25 MCG/INH AEPB Inhale 1 puff into the lungs daily.  12/06/16   [provider]  gabapentin (NEURONTIN) 300 MG capsule Take 1-3 tablets nightly 09/01/16   [provider]  guaiFENesin-dextromethorphan (ROBITUSSIN DM) 100-10 MG/5ML syrup Take 5 mLs by mouth every 4 (four) hours as needed for cough. 08/13/17   Adrian Saran, MD  loratadine (CLARITIN) 10 MG tablet Take 10 mg by mouth daily.     [provider]  Melatonin 10 MG TABS Take 1 tablet by mouth at bedtime    [provider]  omeprazole (PRILOSEC) 20 MG capsule Take 20 mg by mouth daily.    [provider]  Respiratory Therapy Supplies (FLUTTER) DEVI Use 1 each 3 (three) times daily. 05/24/16   [provider]    Allergies Hydrocodone and Hydrocodone-homatropine  No family history on file.  Social History Social History   Tobacco Use  . Smoking status: Former Smoker    Packs/day: 1.00  Years: 30.00    Pack years: 30.00    Types: Cigarettes    Quit date: 01/01/2006    Years since quitting: 13.2  . Smokeless tobacco: Never Used  . Tobacco comment: patient does not smoke  Substance Use Topics  . Alcohol use: Never  . Drug use: Not on file    Review of Systems Constitutional: Positive for fever/chills Eyes: No visual changes. ENT: No sore throat. Cardiovascular: Denies chest pain. Respiratory: Positive for dyspnea and  cough Gastrointestinal: No abdominal pain.  No nausea, no vomiting.  No diarrhea.  No constipation. Genitourinary: Negative for dysuria. Musculoskeletal: Negative for neck pain.  Negative for back pain. Integumentary: Negative for rash. Neurological: Negative for headaches, focal weakness or numbness.   ____________________________________________   PHYSICAL EXAM:  VITAL SIGNS: ED Triage Vitals  Enc Vitals Group     BP 04/12/19 2303 116/73     Pulse Rate 04/12/19 2303 69     Resp 04/12/19 2303 20     Temp 04/12/19 2303 99.5 F (37.5 C)     Temp Source 04/12/19 2303 Oral     SpO2 04/12/19 2303 96 %     Weight 04/12/19 2258 68 kg (150 lb)     Height 04/12/19 2258 1.727 m (5\' 8" )     Head Circumference --      Peak Flow --      Pain Score 04/12/19 2258 0     Pain Loc --      Pain Edu? --      Excl. in [place]? --     Constitutional: Alert and oriented.  Apparent respiratory difficulty, coughing Eyes: Conjunctivae are normal.  Mouth/Throat: Patient is wearing a mask. Neck: No stridor.  No meningeal signs.   Cardiovascular: Normal rate, regular rhythm. Good peripheral circulation. Grossly normal heart sounds. Respiratory: Normal respiratory effort.  Diffuse rhonchi Gastrointestinal: Soft and nontender. No distention.  Musculoskeletal: No lower extremity tenderness nor edema. No gross deformities of extremities. Neurologic:  Normal speech and language. No gross focal neurologic deficits are appreciated.  Skin:  Skin is warm, dry and intact. Psychiatric: Mood and affect are normal. Speech and behavior are normal.  ____________________________________________   LABS (all labs ordered are listed, but only abnormal results are displayed)  Labs Reviewed  BASIC METABOLIC PANEL - Abnormal; Notable for the following components:      Result Value   Glucose, Bld 108 (*)    Calcium 8.6 (*)    All other components within normal limits  CULTURE, BLOOD (ROUTINE X 2)  CULTURE, BLOOD  (ROUTINE X 2)  CBC  URINALYSIS, COMPLETE (UACMP) WITH MICROSCOPIC  LACTIC ACID, PLASMA  LACTIC ACID, PLASMA  CBC WITH DIFFERENTIAL/PLATELET  FIBRIN DERIVATIVES D-DIMER ([facility] ONLY)  PROCALCITONIN  LACTATE DEHYDROGENASE  FERRITIN  TRIGLYCERIDES  FIBRINOGEN  C-REACTIVE PROTEIN   ____________________________________________  EKG  ED ECG REPORT I, Vergas N Catrell Morrone, the attending physician, personally viewed and interpreted this ECG.   Date: 04/13/2019  EKG Time: 5:47 AM  Rate: 62  Rhythm: Normal sinus rhythm  Axis: Normal  Intervals: Normal  ST&T Change: None  ____________________________________________  RADIOLOGY I, [place] N Rashmi Tallent, personally viewed and evaluated these images (plain radiographs) as part of my medical decision making, as well as reviewing the written report by the radiologist.  ED MD interpretation: Findings of chronic COPD no acute cardiopulmonary process on chest x-ray per radiologist.  Official radiology report(s): DG Chest 2 View  Result Date: 04/12/2019 CLINICAL DATA:  Increased shortness of  breath EXAM: CHEST - 2 VIEW COMPARISON:  Aug 12, 2017 FINDINGS: The heart size and mediastinal contours are unchanged. There is hyperinflation of the upper lung zones with flattening of the hemidiaphragms. No new airspace consolidation or pleural effusion. IMPRESSION: Findings of chronic COPD.  No acute cardiopulmonary process. Electronically Signed   By: Jonna Clark M.D.   On: 04/12/2019 23:30      Procedures   ____________________________________________   INITIAL IMPRESSION / MDM / ASSESSMENT AND PLAN / ED COURSE  As part of my medical decision making, I reviewed the following data within the electronic MEDICAL RECORD NUMBER   64 year old male presented with above-stated history and physical exam differential diagnosis including COVID-19 pneumonia, superimposed bacterial pneumonia, COPD exacerbation.  Patient given Decadron 10 mg IV remdesivir ordered.   Laboratory data pending at this time however anticipate admission.  Patient care transferred to Dr. Lenard Lance ____________________________________________  FINAL CLINICAL IMPRESSION(S) / ED DIAGNOSES  Final diagnoses:  COVID-19 virus infection     MEDICATIONS GIVEN DURING THIS VISIT:  Medications  dexamethasone (DECADRON) injection 10 mg (has no administration in time range)  remdesivir 200 mg in sodium chloride 0.9% 250 mL IVPB (has no administration in time range)    Followed by  remdesivir 100 mg in sodium chloride 0.9 % 100 mL IVPB (has no administration in time range)     ED Discharge Orders    None      *Please note:  Bryan Montoya was evaluated in Emergency Department on 04/13/2019 for the symptoms described in the history of present illness. He was evaluated in the context of the global COVID-19 pandemic, which necessitated consideration that the patient might be at risk for infection with the SARS-CoV-2 virus that causes COVID-19. Institutional protocols and algorithms that pertain to the evaluation of patients at risk for COVID-19 are in a state of rapid change based on information released by regulatory bodies including the CDC and federal and state organizations. These policies and algorithms were followed during the patient's care in the ED.  Some ED evaluations and interventions may be delayed as a result of limited staffing during the pandemic.*  Note:  This document was prepared using Dragon voice recognition software and may include unintentional dictation errors.   Darci Current, MD 04/14/19 [phone]

## 2019-04-13 NOTE — ED Notes (Signed)
Admitting Dr at bedside

## 2019-04-13 NOTE — ED Notes (Signed)
Pt given meal tray.

## 2019-04-13 NOTE — ED Notes (Signed)
Pt given brown paper bag that wife dropped off for him

## 2019-04-13 NOTE — ED Notes (Signed)
Pt given remote for TV 

## 2019-04-13 NOTE — ED Notes (Signed)
Report given to Ally, RN 

## 2019-04-13 NOTE — ED Notes (Signed)
Pt ambulated to toilet with steady gait 

## 2019-04-14 LAB — CBC WITH DIFFERENTIAL/PLATELET
Abs Immature Granulocytes: 0.03 10*3/uL (ref 0.00–0.07)
Basophils Absolute: 0 10*3/uL (ref 0.0–0.1)
Basophils Relative: 0 %
Eosinophils Absolute: 0 10*3/uL (ref 0.0–0.5)
Eosinophils Relative: 0 %
HCT: 41.1 % (ref 39.0–52.0)
Hemoglobin: 13.3 g/dL (ref 13.0–17.0)
Immature Granulocytes: 1 %
Lymphocytes Relative: 21 %
Lymphs Abs: 1.3 10*3/uL (ref 0.7–4.0)
MCH: 29.3 pg (ref 26.0–34.0)
MCHC: 32.4 g/dL (ref 30.0–36.0)
MCV: 90.5 fL (ref 80.0–100.0)
Monocytes Absolute: 0.9 10*3/uL (ref 0.1–1.0)
Monocytes Relative: 15 %
Neutro Abs: 3.8 10*3/uL (ref 1.7–7.7)
Neutrophils Relative %: 63 %
Platelets: 169 10*3/uL (ref 150–400)
RBC: 4.54 MIL/uL (ref 4.22–5.81)
RDW: 13.7 % (ref 11.5–15.5)
WBC: 6 10*3/uL (ref 4.0–10.5)
nRBC: 0 % (ref 0.0–0.2)

## 2019-04-14 LAB — HEPATIC FUNCTION PANEL
ALT: 24 U/L (ref 0–44)
AST: 27 U/L (ref 15–41)
Albumin: 3.3 g/dL — ABNORMAL LOW (ref 3.5–5.0)
Alkaline Phosphatase: 56 U/L (ref 38–126)
Bilirubin, Direct: <0.1 mg/dL (ref 0.0–0.2)
Total Bilirubin: 0.7 mg/dL (ref 0.3–1.2)
Total Protein: 6.2 g/dL — ABNORMAL LOW (ref 6.5–8.1)

## 2019-04-14 LAB — HIV ANTIBODY (ROUTINE TESTING W REFLEX): HIV Screen 4th Generation wRfx: NONREACTIVE

## 2019-04-14 LAB — FIBRIN DERIVATIVES D-DIMER (ARMC ONLY): Fibrin derivatives D-dimer (ARMC): 508.34 ng/mL (FEU) — ABNORMAL HIGH (ref 0.00–499.00)

## 2019-04-14 LAB — FERRITIN: Ferritin: 195 ng/mL (ref 24–336)

## 2019-04-14 MED ORDER — IPRATROPIUM-ALBUTEROL 20-100 MCG/ACT IN AERS
1.0000 | INHALATION_SPRAY | Freq: Four times a day (QID) | RESPIRATORY_TRACT | Status: DC
  Administered 2019-04-14 – 2019-04-16 (×9): 1 via RESPIRATORY_TRACT
  Filled 2019-04-14: qty 4

## 2019-04-14 MED ORDER — DOXYCYCLINE HYCLATE 100 MG PO TABS
100.0000 mg | ORAL_TABLET | Freq: Two times a day (BID) | ORAL | Status: AC
  Administered 2019-04-14 – 2019-04-15 (×3): 100 mg via ORAL
  Filled 2019-04-14 (×3): qty 1

## 2019-04-14 NOTE — Progress Notes (Signed)
Ch spoke with Pt over the phone. Pt said he got here on Saturday. Pt expressed how well he has been taken care of here; and also that he has been in touch with his family. Pt said "I believe in prayer" requested prayer for God to have His way in his life. Ch prayed with the patient and let pt know that chaplains are available any time to get spiritual, emotional support.

## 2019-04-14 NOTE — Progress Notes (Signed)
PROGRESS NOTE    Bryan Montoya  EQA:[id number] DOB: [birth year] DOA: 04/13/2019 PCP: Baxter Hire, MD    Assessment & Plan:   Principal Problem:   Pneumonia due to COVID-19 virus Active Problems:   Chronic systolic HF (heart failure) (HCC)   COPD, moderate ([place])   Acute respiratory failure with hypoxemia (HCC)    Bryan Montoya is an 64 y.o. Caucasian male with medical history significant for COPD, chronic systolic CHF (EF was estimated at 45% on echo in October 2017) chronic hypoxemic respiratory failure on 2 L of oxygen, arthritis, GERD, who presented to the hospital because of fever and shortness of breath.  He noticed that his oxygen saturation dropped into the 70s even while on 2 L of oxygen.  He was seen recently at his doctor's office (January 2021) for COPD exacerbation and prescribed prednisone and doxycycline.   Acute on chronic hypoxemic respiratory failure 2/2 COVID PNA --requiring 4 L/min oxygen on presentation.   --CRP, procal, D-dimer all low PLAN: --continue Remdesivir (started on 1/10) --continue PO Decadron --scheduled Combivent --chest PT  COPD on home 2L --with recently treated for exacerbation, currently without wheezing. PLAN: --Continue bronchodilators and doxycycline (day 5 on 9/79)  Chronic systolic CHF, stable   DVT prophylaxis: Lovenox SQ Code Status: Full code  Disposition Plan: Home   Subjective and Interval History:  Doing better.  Coughing.  Has DOE.  No dyspnea, chest pain, abdominal pain, N/V/D, dysuria, increased swelling.   Objective: Vitals:   04/13/19 2015 04/13/19 2028 04/13/19 2059 04/14/19 1603  BP: 123/85   125/79  Pulse: (!) 58     Resp: 12     Temp:  98.5 F (36.9 C) 98.5 F (36.9 C)   TempSrc:  Oral Oral   SpO2: 97%     Weight:      Height:        Intake/Output Summary (Last 24 hours) at 04/14/2019 1720 Last data filed at 04/14/2019 1719 Gross per 24 hour  Intake 104.14 ml  Output --  Net 104.14 ml    Filed Weights   04/12/19 2258  Weight: 68 kg    Examination:   Constitutional: NAD, AAOx3 HEENT: conjunctivae and lids normal, EOMI CV: RRR no M,R,G. Distal pulses +2.  No cyanosis.   RESP: CTA B/L, no wheezes, on 2L GI: +BS, NTND Extremities: No effusions, edema, or tenderness in BLE SKIN: warm, dry and intact Neuro: II - XII grossly intact.  Sensation intact Psych: Normal mood and affect.  Appropriate judgement and reason   Data Reviewed: I have personally reviewed following labs and imaging studies  CBC: Recent Labs  Lab 04/12/19 2310 04/13/19 0543 04/14/19 0638  WBC 7.9 6.7 6.0  NEUTROABS  --  4.9 3.8  HGB 13.4 14.3 13.3  HCT 40.8 43.7 41.1  MCV 89.9 91.2 90.5  PLT 178 171 892   Basic Metabolic Panel: Recent Labs  Lab 04/12/19 2310  NA 138  K 4.0  CL 104  CO2 23  GLUCOSE 108*  BUN 19  CREATININE 0.92  CALCIUM 8.6*   GFR: Estimated Creatinine Clearance: 77 mL/min (by C-G formula based on SCr of 0.92 mg/dL). Liver Function Tests: Recent Labs  Lab 04/14/19 0638  AST 27  ALT 24  ALKPHOS 56  BILITOT 0.7  PROT 6.2*  ALBUMIN 3.3*   No results for input(s): LIPASE, AMYLASE in the last 168 hours. No results for input(s): AMMONIA in the last 168 hours. Coagulation Profile: No results for  input(s): INR, PROTIME in the last 168 hours. Cardiac Enzymes: No results for input(s): CKTOTAL, CKMB, CKMBINDEX, TROPONINI in the last 168 hours. BNP (last 3 results) No results for input(s): PROBNP in the last 8760 hours. HbA1C: No results for input(s): HGBA1C in the last 72 hours. CBG: No results for input(s): GLUCAP in the last 168 hours. Lipid Profile: Recent Labs    04/13/19 0543  TRIG 81   Thyroid Function Tests: No results for input(s): TSH, T4TOTAL, FREET4, T3FREE, THYROIDAB in the last 72 hours. Anemia Panel: Recent Labs    04/13/19 0543 04/14/19 0638  FERRITIN 170 195   Sepsis Labs: Recent Labs  Lab 04/13/19 0543 04/13/19 0727   PROCALCITON <0.10  --   LATICACIDVEN  --  1.1    Recent Results (from the past 240 hour(s))  Blood Culture (routine x 2)     Status: None (Preliminary result)   Collection Time: 04/13/19  5:42 AM   Specimen: BLOOD  Result Value Ref Range Status   Specimen Description BLOOD BLOOD LEFT WRIST  Final   Special Requests   Final    BOTTLES DRAWN AEROBIC AND ANAEROBIC Blood Culture adequate volume   Culture   Final    NO GROWTH < 24 HOURS Performed at [facility], [address], [place], [place] [address]    Report Status PENDING  Incomplete  Blood Culture (routine x 2)     Status: None (Preliminary result)   Collection Time: 04/13/19  5:43 AM   Specimen: BLOOD  Result Value Ref Range Status   Specimen Description BLOOD BLOOD LEFT FOREARM  Final   Special Requests   Final    BOTTLES DRAWN AEROBIC AND ANAEROBIC Blood Culture adequate volume   Culture   Final    NO GROWTH < 24 HOURS Performed at [facility], [address] [place], [place] [address]    Report Status PENDING  Incomplete      Radiology Studies: DG Chest 2 View  Result Date: 04/12/2019 CLINICAL DATA:  Increased shortness of breath EXAM: CHEST - 2 VIEW COMPARISON:  Aug 12, 2017 FINDINGS: The heart size and mediastinal contours are unchanged. There is hyperinflation of the upper lung zones with flattening of the hemidiaphragms. No new airspace consolidation or pleural effusion. IMPRESSION: Findings of chronic COPD.  No acute cardiopulmonary process. Electronically Signed   By: Jonna Clark M.D.   On: 04/12/2019 23:30     Scheduled Meds: . busPIRone  5 mg Oral BID  . dexamethasone  6 mg Oral Q24H  . doxycycline  100 mg Oral BID  . enoxaparin (LOVENOX) injection  40 mg Subcutaneous Q24H  . gabapentin  300 mg Oral QHS  . Ipratropium-Albuterol  1 puff Inhalation Q6H  . loratadine  10 mg Oral Daily  . Melatonin  1 tablet Oral QHS  . pantoprazole  40 mg Oral Daily   Continuous Infusions: .  remdesivir 100 mg in NS 100 mL Stopped (04/14/19 1028)     LOS: 1 day     Darlin Priestly, MD [facility] If 7PM-7AM, please contact night-coverage 04/14/2019, 5:20 PM

## 2019-04-15 LAB — MAGNESIUM: Magnesium: 2.3 mg/dL (ref 1.7–2.4)

## 2019-04-15 LAB — CBC
HCT: 40.7 % (ref 39.0–52.0)
Hemoglobin: 13.5 g/dL (ref 13.0–17.0)
MCH: 29.8 pg (ref 26.0–34.0)
MCHC: 33.2 g/dL (ref 30.0–36.0)
MCV: 89.8 fL (ref 80.0–100.0)
Platelets: 189 10*3/uL (ref 150–400)
RBC: 4.53 MIL/uL (ref 4.22–5.81)
RDW: 13.5 % (ref 11.5–15.5)
WBC: 5.6 10*3/uL (ref 4.0–10.5)
nRBC: 0 % (ref 0.0–0.2)

## 2019-04-15 LAB — BASIC METABOLIC PANEL
Anion gap: 6 (ref 5–15)
BUN: 25 mg/dL — ABNORMAL HIGH (ref 8–23)
CO2: 28 mmol/L (ref 22–32)
Calcium: 8.4 mg/dL — ABNORMAL LOW (ref 8.9–10.3)
Chloride: 105 mmol/L (ref 98–111)
Creatinine, Ser: 0.79 mg/dL (ref 0.61–1.24)
GFR calc Af Amer: >60 mL/min (ref >60)
GFR calc non Af Amer: >60 mL/min (ref >60)
Glucose, Bld: 123 mg/dL — ABNORMAL HIGH (ref 70–99)
Potassium: 4.1 mmol/L (ref 3.5–5.1)
Sodium: 139 mmol/L (ref 135–145)

## 2019-04-15 MED ORDER — SODIUM CHLORIDE 0.9 % IV SOLN: INTRAVENOUS | Status: DC | PRN

## 2019-04-15 NOTE — Progress Notes (Signed)
Patient complained all night of SOB saying his chest felt very tight as far as breathing was concerned his sats are 98% he is still c/o the chest tightness just feels like he cant take a deep breath. Messaged Dr Fran Lowes and made her aware of same. No new orders at this time.

## 2019-04-15 NOTE — Progress Notes (Signed)
PROGRESS NOTE    Bryan Montoya  JAS:[id number] DOB: Jan 15, 1956 DOA: 04/13/2019 PCP: Baxter Hire, MD    Assessment & Plan:   Principal Problem:   Pneumonia due to COVID-19 virus Active Problems:   Chronic systolic HF (heart failure) (HCC)   COPD, moderate ([place])   Acute respiratory failure with hypoxemia (HCC)    Bryan Montoya is an 64 y.o. Caucasian male with medical history significant for COPD, chronic systolic CHF (EF was estimated at 45% on echo in October 2017) chronic hypoxemic respiratory failure on 2 L of oxygen, arthritis, GERD, who presented to the hospital because of fever and shortness of breath.  He noticed that his oxygen saturation dropped into the 70s even while on 2 L of oxygen.  He was seen recently at his doctor's office (January 2021) for COPD exacerbation and prescribed prednisone and doxycycline.   Acute on chronic hypoxemic respiratory failure 2/2 COVID PNA --requiring 4 L/min oxygen on presentation, already on RA at rest today  --CRP, procal, D-dimer all low PLAN: --continue Remdesivir (started on 1/10) --continue PO Decadron --scheduled Combivent --chest PT  COPD on home 2L --with recently treated for exacerbation, currently without wheezing.  Completed 5 days of doxy on 1/12. PLAN: --Continue bronchodilators   Chronic systolic CHF, stable   DVT prophylaxis: Lovenox SQ Code Status: Full code  Disposition Plan: Home   Subjective and Interval History:  Pt had complained of feeling tight in his chest, which resolved by morning rounds.  No fever, abdominal pain, N/V/D, dysuria, increased swelling.   Objective: Vitals:   04/14/19 1603 04/14/19 2301 04/15/19 0700 04/15/19 1609  BP: 125/79 112/64 139/77 123/87  Pulse:      Resp:      Temp:  98 F (36.7 C) 98 F (36.7 C) 98.2 F (36.8 C)  TempSrc:  Oral Oral Oral  SpO2:      Weight:      Height:        Intake/Output Summary (Last 24 hours) at 04/15/2019 1823 Last data filed at  04/15/2019 1646 Gross per 24 hour  Intake 340 ml  Output -  Net 340 ml   Filed Weights   04/12/19 2258  Weight: 68 kg    Examination:   Constitutional: NAD, AAOx3 HEENT: conjunctivae and lids normal, EOMI CV: RRR no M,R,G. Distal pulses +2.  No cyanosis.   RESP: good air movement but reduced lung sounds, no wheezes, on RA GI: +BS, NTND Extremities: No effusions, edema, or tenderness in BLE SKIN: warm, dry and intact Neuro: II - XII grossly intact.  Sensation intact Psych: Normal mood and affect.  Appropriate judgement and reason   Data Reviewed: I have personally reviewed following labs and imaging studies  CBC: Recent Labs  Lab 04/12/19 2310 04/13/19 0543 04/14/19 0638 04/15/19 0229  WBC 7.9 6.7 6.0 5.6  NEUTROABS  --  4.9 3.8  --   HGB 13.4 14.3 13.3 13.5  HCT 40.8 43.7 41.1 40.7  MCV 89.9 91.2 90.5 89.8  PLT 178 171 169 419   Basic Metabolic Panel: Recent Labs  Lab 04/12/19 2310 04/15/19 0229  NA 138 139  K 4.0 4.1  CL 104 105  CO2 23 28  GLUCOSE 108* 123*  BUN 19 25*  CREATININE 0.92 0.79  CALCIUM 8.6* 8.4*  MG  --  2.3   GFR: Estimated Creatinine Clearance: 88.5 mL/min (by C-G formula based on SCr of 0.79 mg/dL). Liver Function Tests: Recent Labs  Lab 04/14/19 [phone]  AST 27  ALT 24  ALKPHOS 56  BILITOT 0.7  PROT 6.2*  ALBUMIN 3.3*   No results for input(s): LIPASE, AMYLASE in the last 168 hours. No results for input(s): AMMONIA in the last 168 hours. Coagulation Profile: No results for input(s): INR, PROTIME in the last 168 hours. Cardiac Enzymes: No results for input(s): CKTOTAL, CKMB, CKMBINDEX, TROPONINI in the last 168 hours. BNP (last 3 results) No results for input(s): PROBNP in the last 8760 hours. HbA1C: No results for input(s): HGBA1C in the last 72 hours. CBG: No results for input(s): GLUCAP in the last 168 hours. Lipid Profile: Recent Labs    04/13/19 0543  TRIG 81   Thyroid Function Tests: No results for input(s):  TSH, T4TOTAL, FREET4, T3FREE, THYROIDAB in the last 72 hours. Anemia Panel: Recent Labs    04/13/19 0543 04/14/19 0638  FERRITIN 170 195   Sepsis Labs: Recent Labs  Lab 04/13/19 0543 04/13/19 0727  PROCALCITON <0.10  --   LATICACIDVEN  --  1.1    Recent Results (from the past 240 hour(s))  Blood Culture (routine x 2)     Status: None (Preliminary result)   Collection Time: 04/13/19  5:42 AM   Specimen: BLOOD  Result Value Ref Range Status   Specimen Description BLOOD BLOOD LEFT WRIST  Final   Special Requests   Final    BOTTLES DRAWN AEROBIC AND ANAEROBIC Blood Culture adequate volume   Culture   Final    NO GROWTH 2 DAYS Performed at [facility], [address] [place], [place] [address]    Report Status PENDING  Incomplete  Blood Culture (routine x 2)     Status: None (Preliminary result)   Collection Time: 04/13/19  5:43 AM   Specimen: BLOOD  Result Value Ref Range Status   Specimen Description BLOOD BLOOD LEFT FOREARM  Final   Special Requests   Final    BOTTLES DRAWN AEROBIC AND ANAEROBIC Blood Culture adequate volume   Culture   Final    NO GROWTH 2 DAYS Performed at [facility], [address] [place], [place] [address]    Report Status PENDING  Incomplete      Radiology Studies: No results found.   Scheduled Meds: . busPIRone  5 mg Oral BID  . dexamethasone  6 mg Oral Q24H  . doxycycline  100 mg Oral BID  . enoxaparin (LOVENOX) injection  40 mg Subcutaneous Q24H  . gabapentin  300 mg Oral QHS  . Ipratropium-Albuterol  1 puff Inhalation Q6H  . loratadine  10 mg Oral Daily  . Melatonin  1 tablet Oral QHS  . pantoprazole  40 mg Oral Daily   Continuous Infusions: . sodium chloride    . remdesivir 100 mg in NS 100 mL Stopped (04/15/19 0959)     LOS: 2 days     Darlin Priestly, MD [facility] If 7PM-7AM, please contact night-coverage 04/15/2019, 6:23 PM

## 2019-04-16 LAB — BASIC METABOLIC PANEL
Anion gap: 12 (ref 5–15)
BUN: 25 mg/dL — ABNORMAL HIGH (ref 8–23)
CO2: 27 mmol/L (ref 22–32)
Calcium: 9 mg/dL (ref 8.9–10.3)
Chloride: 102 mmol/L (ref 98–111)
Creatinine, Ser: 0.99 mg/dL (ref 0.61–1.24)
GFR calc Af Amer: >60 mL/min (ref >60)
GFR calc non Af Amer: >60 mL/min (ref >60)
Glucose, Bld: 80 mg/dL (ref 70–99)
Potassium: 4.1 mmol/L (ref 3.5–5.1)
Sodium: 141 mmol/L (ref 135–145)

## 2019-04-16 LAB — CBC
HCT: 47.2 % (ref 39.0–52.0)
Hemoglobin: 15.5 g/dL (ref 13.0–17.0)
MCH: 29.7 pg (ref 26.0–34.0)
MCHC: 32.8 g/dL (ref 30.0–36.0)
MCV: 90.4 fL (ref 80.0–100.0)
Platelets: 271 10*3/uL (ref 150–400)
RBC: 5.22 MIL/uL (ref 4.22–5.81)
RDW: 13.4 % (ref 11.5–15.5)
WBC: 6.1 10*3/uL (ref 4.0–10.5)
nRBC: 0 % (ref 0.0–0.2)

## 2019-04-16 LAB — MAGNESIUM: Magnesium: 2.2 mg/dL (ref 1.7–2.4)

## 2019-04-16 MED ORDER — DEXAMETHASONE 6 MG PO TABS: 6.0000 mg | ORAL_TABLET | ORAL | 0 refills | Status: AC

## 2019-04-16 NOTE — Discharge Summary (Signed)
Physician Discharge Summary  [name] RUE:[id number] DOB: 1956-01-18 DOA: 04/13/2019  PCP: Gracelyn Nurse, MD  Admit date: 04/13/2019 Discharge date: 04/16/2019  Admitted From: Home Disposition: Home  Recommendations for Outpatient Follow-up:  1. Follow up with PCP in 1-2 weeks 2.   Home Health: No Equipment/Devices: No  Discharge Condition: Stable CODE STATUS: Full Diet recommendation: Regular  Brief/Interim Summary: Bryan Montoya an 64 y.o.Caucasian malewith medical history significant for COPD,chronic systolic CHF (EF was estimated at 45% on echo in October 2017)chronic hypoxemic respiratory failure on 2 L of oxygen, arthritis, GERD, who presented to the hospital because of fever and shortness of breath. He noticed that his oxygen saturation dropped into the 70s even while on 2 L of oxygen.  He was seen recently athisdoctor's office (January 2021) for COPD exacerbation and prescribed prednisone and doxycycline  1/13: Patient completely weaned off supplemental oxygen.  He states he uses 2 L at night at home and has been doing so chronically.  According to him and his wife he also uses 2 L when working in the yard or otherwise exerting himself.  Titrated off supplemental oxygen while in the room and observed patient.  He was able to speak in complete sentences without increased work of breathing and did not desaturate.  Later that afternoon the nurse was able to ambulate the patient around the nursing station he did so 3 times without any overt desaturation.  Patient is medically stable for discharge at this time.   Discharge Diagnoses:  Principal Problem:   Pneumonia due to COVID-19 virus Active Problems:   Chronic systolic HF (heart failure) (HCC)   COPD, moderate (HCC)   Acute respiratory failure with hypoxemia (HCC)  Acute on chronic hypoxemic respiratory failure 2/2 COVID PNA --requiring 4 L/min oxygen on presentation --Titrated off supplemental oxygen as of  04/15/2019 --CRP, procal, D-dimer all low --Completed for 5 days with remdesivir in-house --Will prescribe Decadron 6 mg p.o. daily to complete a 10-day course --Recommend self quarantine for at least 7 to 10 days post discharge --Medically stable for discharge home  COPDon home 2L --with recently treated for exacerbation, currently without wheezing -- Completed 5 days of doxy on 1/12. --Can continue outpatient chronic COPD management  Chronic systolic CHF, stable   Discharge Instructions  Discharge Instructions    Diet - low sodium heart healthy   Complete by: As directed    Discharge instructions   Complete by: As directed    I recommend self quarantine for a period of 7 to 10 days following her discharge.  This includes sitting in separate bedrooms, using simple bathrooms if possible, not sharing times in common areas like the kitchen and the living room.  I recommend acquiring a accurate digital thermometer as well as a pulse oximeter if you do not already have them.  Please check your temperature as well as your pulse oxygen level twice a day until your symptoms fully resolved.   Increase activity slowly   Complete by: As directed    MyChart COVID-19 home monitoring program   Complete by: Apr 16, 2019    Is the patient willing to use the MyChart Mobile App for home monitoring?: Yes   Temperature monitoring   Complete by: Apr 16, 2019    After how many days would you like to receive a notification of this patient's flowsheet entries?: 1     Allergies as of 04/16/2019      Reactions   Hydrocodone Nausea And  Vomiting   Hydrocodone-homatropine Nausea Only, Nausea And Vomiting      Medication List    STOP taking these medications   doxycycline 100 MG capsule Commonly known as: VIBRAMYCIN     TAKE these medications   albuterol 108 (90 Base) MCG/ACT inhaler Commonly known as: VENTOLIN HFA Inhale 2 puffs into the lungs every 6 (six) hours as needed.   albuterol (2.5  MG/3ML) 0.083% nebulizer solution Commonly known as: PROVENTIL Inhale 3 mLs into the lungs 4 (four) times daily.   dexamethasone 6 MG tablet Commonly known as: DECADRON Take 1 tablet (6 mg total) by mouth daily for 7 days. Start taking on: April 17, 2019   fluticasone 50 MCG/ACT nasal spray Commonly known as: FLONASE Place 2 sprays into both nostrils daily as needed for allergies.   fluticasone furoate-vilanterol 100-25 MCG/INH Aepb Commonly known as: BREO ELLIPTA Inhale 1 puff into the lungs daily.   gabapentin 300 MG capsule Commonly known as: NEURONTIN Take 1-3 tablets nightly   loratadine 10 MG tablet Commonly known as: CLARITIN Take 10 mg by mouth daily.   Melatonin 10 MG Tabs Take 1 tablet by mouth at bedtime   meloxicam 15 MG tablet Commonly known as: MOBIC Take 15 mg by mouth daily.   omeprazole 20 MG capsule Commonly known as: PRILOSEC Take 20 mg by mouth daily.   predniSONE 20 MG tablet Commonly known as: DELTASONE Take 20 mg by mouth 2 (two) times daily.   tiZANidine 4 MG tablet Commonly known as: ZANAFLEX Take 4 mg by mouth 3 (three) times daily as needed.       Allergies  Allergen Reactions  . Hydrocodone Nausea And Vomiting  . Hydrocodone-Homatropine Nausea Only and Nausea And Vomiting    Consultations:  None   Procedures/Studies: DG Chest 2 View  Result Date: 04/12/2019 CLINICAL DATA:  Increased shortness of breath EXAM: CHEST - 2 VIEW COMPARISON:  Aug 12, 2017 FINDINGS: The heart size and mediastinal contours are unchanged. There is hyperinflation of the upper lung zones with flattening of the hemidiaphragms. No new airspace consolidation or pleural effusion. IMPRESSION: Findings of chronic COPD.  No acute cardiopulmonary process. Electronically Signed   By: Jonna Clark M.D.   On: 04/12/2019 23:30   (Echo, Carotid, EGD, Colonoscopy, ERCP)    Subjective:   Discharge Exam: Vitals:   04/16/19 0722 04/16/19 0733  BP:    Pulse: 61    Resp:    Temp:  98.7 F (37.1 C)  SpO2:     Vitals:   04/15/19 1609 04/16/19 0641 04/16/19 0722 04/16/19 0733  BP: 123/87     Pulse:   61   Resp:      Temp: 98.2 F (36.8 C) 98.5 F (36.9 C)  98.7 F (37.1 C)  TempSrc: Oral Oral  Oral  SpO2:      Weight:      Height:        General: Pt is alert, awake, not in acute distress Cardiovascular: RRR, S1/S2 +, no rubs, no gallops Respiratory: Diffusely decreased lung sounds.  Lungs otherwise clear.  Normal work of breathing Abdominal: Soft, NT, ND, bowel sounds + Extremities: no edema, no cyanosis    The results of significant diagnostics from this hospitalization (including imaging, microbiology, ancillary and laboratory) are listed below for reference.     Microbiology: Recent Results (from the past 240 hour(s))  Blood Culture (routine x 2)     Status: None (Preliminary result)   Collection Time: 04/13/19  5:42 AM   Specimen: BLOOD  Result Value Ref Range Status   Specimen Description BLOOD BLOOD LEFT WRIST  Final   Special Requests   Final    BOTTLES DRAWN AEROBIC AND ANAEROBIC Blood Culture adequate volume   Culture   Final    NO GROWTH 3 DAYS Performed at [facility], [address] [place], [place] [address]    Report Status PENDING  Incomplete  Blood Culture (routine x 2)     Status: None (Preliminary result)   Collection Time: 04/13/19  5:43 AM   Specimen: BLOOD  Result Value Ref Range Status   Specimen Description BLOOD BLOOD LEFT FOREARM  Final   Special Requests   Final    BOTTLES DRAWN AEROBIC AND ANAEROBIC Blood Culture adequate volume   Culture   Final    NO GROWTH 3 DAYS Performed at [facility], [address] [place], [place] [address]    Report Status PENDING  Incomplete     Labs: BNP (last 3 results) No results for input(s): BNP in the last 8760 hours. Basic Metabolic Panel: Recent Labs  Lab 04/12/19 2310 04/15/19 0229 04/16/19 0614  NA 138 139 141  K 4.0 4.1  4.1  CL 104 105 102  CO2 23 28 27   GLUCOSE 108* 123* 80  BUN 19 25* 25*  CREATININE 0.92 0.79 0.99  CALCIUM 8.6* 8.4* 9.0  MG  --  2.3 2.2   Liver Function Tests: Recent Labs  Lab 04/14/19 0638  AST 27  ALT 24  ALKPHOS 56  BILITOT 0.7  PROT 6.2*  ALBUMIN 3.3*   No results for input(s): LIPASE, AMYLASE in the last 168 hours. No results for input(s): AMMONIA in the last 168 hours. CBC: Recent Labs  Lab 04/12/19 2310 04/13/19 0543 04/14/19 06/12/19 04/15/19 0229 04/16/19 0614  WBC 7.9 6.7 6.0 5.6 6.1  NEUTROABS  --  4.9 3.8  --   --   HGB 13.4 14.3 13.3 13.5 15.5  HCT 40.8 43.7 41.1 40.7 47.2  MCV 89.9 91.2 90.5 89.8 90.4  PLT 178 171 169 189 271   Cardiac Enzymes: No results for input(s): CKTOTAL, CKMB, CKMBINDEX, TROPONINI in the last 168 hours. BNP: Invalid input(s): POCBNP CBG: No results for input(s): GLUCAP in the last 168 hours. D-Dimer No results for input(s): DDIMER in the last 72 hours. Hgb A1c No results for input(s): HGBA1C in the last 72 hours. Lipid Profile No results for input(s): CHOL, HDL, LDLCALC, TRIG, CHOLHDL, LDLDIRECT in the last 72 hours. Thyroid function studies No results for input(s): TSH, T4TOTAL, T3FREE, THYROIDAB in the last 72 hours.  Invalid input(s): FREET3 Anemia work up Recent Labs    04/14/19 0638  FERRITIN 195   Urinalysis    Component Value Date/Time   COLORURINE YELLOW (A) 04/13/2019 0542   APPEARANCEUR HAZY (A) 04/13/2019 0542   LABSPEC 1.021 04/13/2019 0542   PHURINE 5.0 04/13/2019 0542   GLUCOSEU NEGATIVE 04/13/2019 0542   HGBUR NEGATIVE 04/13/2019 0542   BILIRUBINUR NEGATIVE 04/13/2019 0542   KETONESUR NEGATIVE 04/13/2019 0542   PROTEINUR NEGATIVE 04/13/2019 0542   NITRITE NEGATIVE 04/13/2019 0542   LEUKOCYTESUR NEGATIVE 04/13/2019 0542   Sepsis Labs Invalid input(s): PROCALCITONIN,  WBC,  LACTICIDVEN Microbiology Recent Results (from the past 240 hour(s))  Blood Culture (routine x 2)     Status: None  (Preliminary result)   Collection Time: 04/13/19  5:42 AM   Specimen: BLOOD  Result Value Ref Range Status   Specimen  Description BLOOD BLOOD LEFT WRIST  Final   Special Requests   Final    BOTTLES DRAWN AEROBIC AND ANAEROBIC Blood Culture adequate volume   Culture   Final    NO GROWTH 3 DAYS Performed at [facility], [address] [place], [place] [address]    Report Status PENDING  Incomplete  Blood Culture (routine x 2)     Status: None (Preliminary result)   Collection Time: 04/13/19  5:43 AM   Specimen: BLOOD  Result Value Ref Range Status   Specimen Description BLOOD BLOOD LEFT FOREARM  Final   Special Requests   Final    BOTTLES DRAWN AEROBIC AND ANAEROBIC Blood Culture adequate volume   Culture   Final    NO GROWTH 3 DAYS Performed at [facility], [address], [place],  [address]    Report Status PENDING  Incomplete     Time coordinating discharge: Over 30 minutes  SIGNED:   Sidney Ace, MD  [facility] 04/16/2019, 2:43 PM Pager   If 7PM-7AM, please contact night-coverage [email or web address] Password [email or web address]

## 2019-04-16 NOTE — TOC Initial Note (Signed)
Transition of Care [facility]) - Initial/Assessment Note    Patient Details  Name: Bryan Montoya MRN: [id number] Date of Birth: 09/30/1955  [facility] of [facility] CM/SW Contact:    Allayne Butcher, RN Phone Number: 04/16/2019, 10:35 AM  Clinical Narrative:                 Patient admitted for COVID 19 on chronic O2 2L via [place] at home through [place].  Patient is currently on 2L O2 here in the hospital as well.  Patient has a history of COPD.   Patient is from home and lives with his wife.  Patient is independent in ADL's and drives.  Patient is current with PCP and [facility] [place] for [facility].  Patient does not feel that he needs home health at discharge, he reports that his wife was a Engineer, civil (consulting) for 39 years.   No discharge needs identified at this time.   Expected Discharge Plan: Home/Self Care Barriers to Discharge: Continued Medical Work up   Patient Goals and CMS Choice Patient states their goals for this hospitalization and ongoing recovery are:: To be able to walk around and breath okay at least with oxygen      Expected Discharge Plan and Services Expected Discharge Plan: Home/Self Care       Living arrangements for the past 2 months: Single Family Home                                      Prior Living Arrangements/Services Living arrangements for the past 2 months: Single Family Home Lives with:: Spouse Patient language and need for interpreter reviewed:: Yes Do you feel safe going back to the place where you live?: Yes      Need for Family Participation in Patient Care: Yes (Comment)(COVID and COPD) Care giver support system in place?: Yes (comment)(wife - she was a nurse for 39 years) Current home services: DME(oxygen) Criminal Activity/Legal Involvement Pertinent to Current Situation/Hospitalization: No - Comment as needed  Activities of Daily Living Home Assistive Devices/Equipment: Oxygen ADL Screening (condition at time of  admission) Patient's cognitive ability adequate to safely complete daily activities?: Yes Is the patient deaf or have difficulty hearing?: No Does the patient have difficulty seeing, even when wearing glasses/contacts?: No Does the patient have difficulty concentrating, remembering, or making decisions?: No Patient able to express need for assistance with ADLs?: Yes Does the patient have difficulty dressing or bathing?: No Independently performs ADLs?: Yes (appropriate for developmental age) Does the patient have difficulty walking or climbing stairs?: No Weakness of Legs: None Weakness of Arms/Hands: None  Permission Sought/Granted Permission sought to share information with : Case Manager, Family Supports Permission granted to share information with : Yes, Verbal Permission Granted        Permission granted to share info w Relationship: wife     Emotional Assessment   Attitude/Demeanor/Rapport: Engaged Affect (typically observed): Accepting Orientation: : Oriented to Self, Oriented to Place, Oriented to  Time, Oriented to Situation Alcohol / Substance Use: Not Applicable Psych Involvement: No (comment)  Admission diagnosis:  Acute respiratory failure with hypoxemia (HCC) [J96.01] COVID-19 virus infection [U07.1] Patient Active Problem List   Diagnosis Date Noted  . Acute respiratory failure with hypoxemia (HCC) 04/13/2019  . Pneumonia due to COVID-19 virus 04/13/2019  . Sepsis (HCC) 08/12/2017  . Congenital spondylolisthesis of lumbosacral region 07/31/2016  . Degeneration of  lumbar intervertebral disc 07/31/2016  . Bradycardia 07/20/2016  . Chronic systolic HF (heart failure) ([place]) 07/20/2016  . Back pain with left-sided sciatica 05/08/2016  . Centrilobular emphysema ([place]) 03/21/2016  . Chronic respiratory failure with hypoxia ([place]) 03/21/2016  . Benign essential HTN 01/31/2016  . Frequent PVCs 01/31/2016  . Hyperlipidemia, mixed 01/31/2016  . DOE (dyspnea on exertion)  12/07/2015  . GERD without esophagitis 08/17/2014  . COPD, moderate ([place]) 11/04/2013   PCP:  Baxter Hire, MD Pharmacy:   [facility] [address], [place] AT [place] [address] Phone: [phone] Fax: [phone]  [id number] [place], [place] [address] [place] [address] Phone: [phone] Fax: [phone]     Social Determinants of Health (SDOH) Interventions    Readmission Risk Interventions No flowsheet data found.

## 2019-04-16 NOTE — Progress Notes (Signed)
IVs removed before discharge. Educated patient on discharge instructions and medications. Patient stated that he understood and all questions were answered.

## 2019-04-18 LAB — CULTURE, BLOOD (ROUTINE X 2)
Culture: NO GROWTH
Culture: NO GROWTH
Special Requests: ADEQUATE
Special Requests: ADEQUATE

## 2019-04-28 ENCOUNTER — Other Ambulatory Visit: Payer: Self-pay

## 2019-06-11 ENCOUNTER — Other Ambulatory Visit: Payer: Self-pay | Admitting: Internal Medicine

## 2019-06-11 DIAGNOSIS — I10 Essential (primary) hypertension: Secondary | ICD-10-CM

## 2019-06-11 DIAGNOSIS — J9611 Chronic respiratory failure with hypoxia: Secondary | ICD-10-CM

## 2019-06-18 ENCOUNTER — Other Ambulatory Visit: Payer: Self-pay

## 2019-06-18 ENCOUNTER — Ambulatory Visit
Admission: RE | Admit: 2019-06-18 | Discharge: 2019-06-18 | Disposition: A | Discharge status: Home / Self Care | Payer: Medicare Other | Source: Ambulatory Visit | Attending: Internal Medicine | Admitting: Internal Medicine

## 2019-06-18 ENCOUNTER — Other Ambulatory Visit
Admission: RE | Admit: 2019-06-18 | Discharge: 2019-06-18 | Disposition: A | Discharge status: Home / Self Care | Payer: Medicare Other | Source: Ambulatory Visit | Attending: Internal Medicine | Admitting: Internal Medicine

## 2019-06-18 DIAGNOSIS — J9611 Chronic respiratory failure with hypoxia: Secondary | ICD-10-CM | POA: Diagnosis not present

## 2019-06-18 DIAGNOSIS — I10 Essential (primary) hypertension: Secondary | ICD-10-CM | POA: Insufficient documentation

## 2019-06-18 LAB — COMPREHENSIVE METABOLIC PANEL
ALT: 23 U/L (ref 0–44)
AST: 25 U/L (ref 15–41)
Albumin: 4 g/dL (ref 3.5–5.0)
Alkaline Phosphatase: 62 U/L (ref 38–126)
Anion gap: 6 (ref 5–15)
BUN: 20 mg/dL (ref 8–23)
CO2: 31 mmol/L (ref 22–32)
Calcium: 8.7 mg/dL — ABNORMAL LOW (ref 8.9–10.3)
Chloride: 100 mmol/L (ref 98–111)
Creatinine, Ser: 0.91 mg/dL (ref 0.61–1.24)
GFR calc Af Amer: >60 mL/min (ref >60)
GFR calc non Af Amer: >60 mL/min (ref >60)
Glucose, Bld: 53 mg/dL — ABNORMAL LOW (ref 70–99)
Potassium: 4.2 mmol/L (ref 3.5–5.1)
Sodium: 137 mmol/L (ref 135–145)
Total Bilirubin: 0.6 mg/dL (ref 0.3–1.2)
Total Protein: 6.9 g/dL (ref 6.5–8.1)

## 2019-06-18 MED ORDER — IOHEXOL 300 MG/ML  SOLN
75.0000 mL | Freq: Once | INTRAMUSCULAR | Status: AC | PRN
  Administered 2019-06-18: 75 mL via INTRAVENOUS

## 2020-04-15 ENCOUNTER — Emergency Department
Admission: EM | Admit: 2020-04-15 | Discharge: 2020-04-15 | Disposition: A | Discharge status: Home / Self Care | Payer: Medicare Other | Attending: Emergency Medicine | Admitting: Emergency Medicine

## 2020-04-15 ENCOUNTER — Emergency Department: Payer: Medicare Other

## 2020-04-15 ENCOUNTER — Other Ambulatory Visit: Payer: Self-pay

## 2020-04-15 DIAGNOSIS — Z87891 Personal history of nicotine dependence: Secondary | ICD-10-CM | POA: Diagnosis not present

## 2020-04-15 DIAGNOSIS — Z7951 Long term (current) use of inhaled steroids: Secondary | ICD-10-CM | POA: Insufficient documentation

## 2020-04-15 DIAGNOSIS — Z79899 Other long term (current) drug therapy: Secondary | ICD-10-CM | POA: Insufficient documentation

## 2020-04-15 DIAGNOSIS — I110 Hypertensive heart disease with heart failure: Secondary | ICD-10-CM | POA: Insufficient documentation

## 2020-04-15 DIAGNOSIS — J9611 Chronic respiratory failure with hypoxia: Secondary | ICD-10-CM | POA: Diagnosis not present

## 2020-04-15 DIAGNOSIS — I5022 Chronic systolic (congestive) heart failure: Secondary | ICD-10-CM | POA: Insufficient documentation

## 2020-04-15 DIAGNOSIS — Z20822 Contact with and (suspected) exposure to covid-19: Secondary | ICD-10-CM | POA: Diagnosis not present

## 2020-04-15 DIAGNOSIS — J441 Chronic obstructive pulmonary disease with (acute) exacerbation: Secondary | ICD-10-CM | POA: Diagnosis not present

## 2020-04-15 DIAGNOSIS — R0602 Shortness of breath: Secondary | ICD-10-CM | POA: Diagnosis present

## 2020-04-15 LAB — PROCALCITONIN: Procalcitonin: <0.1 ng/mL

## 2020-04-15 LAB — CBC
HCT: 40.9 % (ref 39.0–52.0)
Hemoglobin: 13.6 g/dL (ref 13.0–17.0)
MCH: 30.2 pg (ref 26.0–34.0)
MCHC: 33.3 g/dL (ref 30.0–36.0)
MCV: 90.7 fL (ref 80.0–100.0)
Platelets: 223 10*3/uL (ref 150–400)
RBC: 4.51 MIL/uL (ref 4.22–5.81)
RDW: 13.4 % (ref 11.5–15.5)
WBC: 7 10*3/uL (ref 4.0–10.5)
nRBC: 0 % (ref 0.0–0.2)

## 2020-04-15 LAB — BASIC METABOLIC PANEL
Anion gap: 9 (ref 5–15)
BUN: 17 mg/dL (ref 8–23)
CO2: 28 mmol/L (ref 22–32)
Calcium: 8.7 mg/dL — ABNORMAL LOW (ref 8.9–10.3)
Chloride: 105 mmol/L (ref 98–111)
Creatinine, Ser: 0.87 mg/dL (ref 0.61–1.24)
GFR, Estimated: >60 mL/min (ref >60)
Glucose, Bld: 95 mg/dL (ref 70–99)
Potassium: 4.1 mmol/L (ref 3.5–5.1)
Sodium: 142 mmol/L (ref 135–145)

## 2020-04-15 LAB — BRAIN NATRIURETIC PEPTIDE: B Natriuretic Peptide: 73.1 pg/mL (ref 0.0–100.0)

## 2020-04-15 LAB — TROPONIN I (HIGH SENSITIVITY): Troponin I (High Sensitivity): 4 ng/L (ref <18)

## 2020-04-15 MED ORDER — IPRATROPIUM-ALBUTEROL 0.5-2.5 (3) MG/3ML IN SOLN
3.0000 mL | Freq: Once | RESPIRATORY_TRACT | Status: AC
  Administered 2020-04-15: 3 mL via RESPIRATORY_TRACT

## 2020-04-15 MED ORDER — PREDNISONE 10 MG PO TABS: 40.0000 mg | ORAL_TABLET | Freq: Every day | ORAL | 0 refills | Status: AC

## 2020-04-15 MED ORDER — DOXYCYCLINE HYCLATE 100 MG PO CAPS: 100.0000 mg | ORAL_CAPSULE | Freq: Two times a day (BID) | ORAL | 0 refills | Status: AC

## 2020-04-15 NOTE — ED Provider Notes (Signed)
[facility] Emergency Department Provider Note  ____________________________________________   Event Date/Time   First MD Initiated Contact with Patient 04/15/20 1950     (approximate)  I have reviewed the triage vital signs and the nursing notes.   HISTORY  Chief Complaint COPD   HPI Bryan Montoya is a 65 y.o. male with a past medical history of arthritis, GERD and chronic hypoxic respiratory failure on 2 L nasal cannula secondary to COPD from remote tobacco abuse who presents for assessment approximately 2 days of some slightly increased shortness of breath and cough from baseline.  Patient states this feels very similar to prior COPD exacerbations in the past.  He denies any chest pain, headache, earache, sore throat, fevers, chills, hemoptysis, abdominal pain, urinary symptoms, nausea, vomiting, diarrhea, rash or acute extremity pain.  He states he has albuterol at home has been using this but feels he needs some steroids and antibiotics as well as it is only been helping a little bit.  No other acute concerns or symptoms at this time.          Past Medical History:  Diagnosis Date  . Arthritis   . COPD (chronic obstructive pulmonary disease) (HCC)   . GERD (gastroesophageal reflux disease)     Patient Active Problem List   Diagnosis Date Noted  . Acute respiratory failure with hypoxemia (HCC) 04/13/2019  . Pneumonia due to COVID-19 virus 04/13/2019  . Sepsis (HCC) 08/12/2017  . Congenital spondylolisthesis of lumbosacral region 07/31/2016  . Degeneration of lumbar intervertebral disc 07/31/2016  . Bradycardia 07/20/2016  . Chronic systolic HF (heart failure) (HCC) 07/20/2016  . Back pain with left-sided sciatica 05/08/2016  . Centrilobular emphysema (HCC) 03/21/2016  . Chronic respiratory failure with hypoxia (HCC) 03/21/2016  . Benign essential HTN 01/31/2016  . Frequent PVCs 01/31/2016  . Hyperlipidemia, mixed 01/31/2016  . DOE (dyspnea  on exertion) 12/07/2015  . GERD without esophagitis 08/17/2014  . COPD, moderate (HCC) 11/04/2013    Past Surgical History:  Procedure Laterality Date  . BACK SURGERY     x2    Prior to Admission medications   Medication Sig Start Date End Date Taking? Authorizing Provider  doxycycline (VIBRAMYCIN) 100 MG capsule Take 1 capsule (100 mg total) by mouth 2 (two) times daily for 7 days. 04/15/20 04/22/20 Yes Gilles Chiquito, MD  predniSONE (DELTASONE) 10 MG tablet Take 4 tablets (40 mg total) by mouth daily for 5 days. 04/15/20 04/20/20 Yes Gilles Chiquito, MD  albuterol (PROVENTIL HFA;VENTOLIN HFA) 108 (90 Base) MCG/ACT inhaler Inhale 2 puffs into the lungs every 6 (six) hours as needed.  12/06/16 04/13/19  [provider]  albuterol (PROVENTIL) (2.5 MG/3ML) 0.083% nebulizer solution Inhale 3 mLs into the lungs 4 (four) times daily. 01/14/18   [provider]  fluticasone (FLONASE) 50 MCG/ACT nasal spray Place 2 sprays into both nostrils daily as needed for allergies.     [provider]  fluticasone furoate-vilanterol (BREO ELLIPTA) 100-25 MCG/INH AEPB Inhale 1 puff into the lungs daily.  12/06/16   [provider]  gabapentin (NEURONTIN) 300 MG capsule Take 1-3 tablets nightly 09/01/16   [provider]  loratadine (CLARITIN) 10 MG tablet Take 10 mg by mouth daily.     [provider]  Melatonin 10 MG TABS Take 1 tablet by mouth at bedtime    [provider]  meloxicam (MOBIC) 15 MG tablet Take 15 mg by mouth daily. 03/24/19   [provider]  omeprazole (PRILOSEC) 20 MG capsule Take 20 mg by mouth daily.    [provider]  tiZANidine (ZANAFLEX) 4 MG tablet Take 4 mg by mouth 3 (three) times daily as needed. 02/13/19   [provider]    Allergies Hydrocodone and Hydrocodone-homatropine  No family history on file.  Social History Social History   Tobacco Use  . Smoking status: Former Smoker     Packs/day: 1.00    Years: 30.00    Pack years: 30.00    Types: Cigarettes    Quit date: 01/01/2006    Years since quitting: 14.2  . Smokeless tobacco: Never Used  . Tobacco comment: patient does not smoke  Vaping Use  . Vaping Use: Never used  Substance Use Topics  . Alcohol use: Never  . Drug use: Never    Review of Systems  Review of Systems  Constitutional: Negative for chills and fever.  HENT: Negative for sore throat.   Eyes: Negative for pain.  Respiratory: Positive for cough and shortness of breath. Negative for stridor.   Cardiovascular: Negative for chest pain.  Gastrointestinal: Negative for vomiting.  Genitourinary: Negative for dysuria.  Musculoskeletal: Negative for myalgias.  Skin: Negative for rash.  Neurological: Negative for seizures, loss of consciousness and headaches.  Psychiatric/Behavioral: Negative for suicidal ideas.  All other systems reviewed and are negative.     ____________________________________________   PHYSICAL EXAM:  VITAL SIGNS: ED Triage Vitals  Enc Vitals Group     BP 04/15/20 1657 127/82     Pulse Rate 04/15/20 1657 60     Resp 04/15/20 1657 (!) 22     Temp 04/15/20 1657 98.1 F (36.7 C)     Temp Source 04/15/20 1657 Oral     SpO2 04/15/20 1657 100 %     Weight 04/15/20 1658 160 lb (72.6 kg)     Height 04/15/20 1658 5\' 8"  (1.727 m)     Head Circumference --      Peak Flow --      Pain Score 04/15/20 1658 0     Pain Loc --      Pain Edu? --      Excl. in GC? --    Vitals:   04/15/20 1657  BP: 127/82  Pulse: 60  Resp: (!) 22  Temp: 98.1 F (36.7 C)  SpO2: 100%   Physical Exam Vitals and nursing note reviewed.  Constitutional:      General: He is not in acute distress.    Appearance: He is well-developed and well-nourished.  HENT:     Head: Normocephalic and atraumatic.     Right Ear: External ear normal.     Left Ear: External ear normal.     Nose: Nose normal.  Eyes:     Conjunctiva/sclera: Conjunctivae  normal.  Cardiovascular:     Rate and Rhythm: Normal rate and regular rhythm.     Heart sounds: No murmur heard.   Pulmonary:     Effort: Pulmonary effort is normal. Tachypnea present. No respiratory distress.     Breath sounds: Examination of the right-lower field reveals wheezing. Examination of the left-lower field reveals wheezing. Wheezing present. No rhonchi or rales.     Comments: Very slight end expiratory bilateral wheeze Abdominal:     Palpations: Abdomen is soft.     Tenderness: There is no abdominal tenderness. There is no right CVA tenderness or left CVA tenderness.  Musculoskeletal:        General: No  edema.     Cervical back: Neck supple.     Right lower leg: No edema.     Left lower leg: No edema.  Skin:    General: Skin is warm and dry.     Capillary Refill: Capillary refill takes less than 2 seconds.  Neurological:     Mental Status: He is alert and oriented to person, place, and time.  Psychiatric:        Mood and Affect: Mood and affect and mood normal.      ____________________________________________   LABS (all labs ordered are listed, but only abnormal results are displayed)  Labs Reviewed  BASIC METABOLIC PANEL - Abnormal; Notable for the following components:      Result Value   Calcium 8.7 (*)    All other components within normal limits  SARS CORONAVIRUS 2 (TAT 6-24 HRS)  CBC  BRAIN NATRIURETIC PEPTIDE  PROCALCITONIN  TROPONIN I (HIGH SENSITIVITY)   ____________________________________________  EKG  Sinus bradycardia with a ventricular rate of 59, normal axis, unremarkable intervals, some artifact in lead I and lead III without any clear evidence of acute ischemia or significant underlying arrhythmia. ____________________________________________  RADIOLOGY  ED MD interpretation: Chronic appearing hyper inflation and left base atelectasis without clear focal consolidation, large effusion, edema, no thorax or other acute intrathoracic  process.   Official radiology report(s): DG Chest 2 View  Result Date: 04/15/2020 CLINICAL DATA:  COPD exacerbation. Recent hospitalization, discharged earlier today. EXAM: CHEST - 2 VIEW COMPARISON:  Most recent radiograph 04/12/2019 FINDINGS: Chronic hyperinflation and emphysema. Lower lobe bronchial thickening. Ill-defined patchy at the left lung base. No pleural effusion, pneumothorax, or pulmonary edema. The heart is normal in size. Normal mediastinal contours. No acute osseous abnormalities are seen. IMPRESSION: Chronic hyperinflation and emphysema, consistent with COPD. Ill-defined patchy at the left lung base may be atelectasis or pneumonia. Electronically Signed   By: Narda RutherfordMelanie  Sanford M.D.   On: 04/15/2020 17:22    ____________________________________________   PROCEDURES  Procedure(s) performed (including Critical Care):  .1-3 Lead EKG Interpretation Performed by: Gilles ChiquitoSmith, Ailanie Ruttan P, MD Authorized by: Gilles ChiquitoSmith, Luqman Perrelli P, MD     Interpretation: normal     ECG rate assessment: normal     Rhythm: sinus rhythm     Ectopy: none     Conduction: normal       ____________________________________________   INITIAL IMPRESSION / ASSESSMENT AND PLAN / ED COURSE      Patient presents with ST history exam for assessment of some shortness of breath and cough increased from baseline of the last couple of days.  He has been wearing his 2 L and has not needed to increase this at all.  On arrival he is afebrile outside tachypneic at 22 with otherwise stable vital signs and SPO2 of 100% on 2 L.  No significant wheezing auscultated on exam or other abnormal cardiorespiratory findings.  Differential includes mild COPD exacerbation versus viral URI, ACS, arrhythmia, PE, symptomatic anemia, pneumothorax, and heart failure.  Chest x-ray has no clear focal consolidation and given absence of fever or blood cell count low suspicion for bacterial infection at this time.  In addition to no evidence  of leukocytosis CBC has no evidence of acute anemia.  BMP has no significant electrolyte or metabolic derangements.  Low suspicion for ACS or myocarditis given reassuring EKG and patient denying any chest pain with nonelevated troponin pain greater than 3 hours after symptom onset.  No historical, exam findings, chest x-ray findings or  elevated BNP to suggest acute volume overload. No evidence of new oxygen requirement or worsening hypoxic respiratory failure.  Low suspicion for PE given absence of any new hypoxia or tachycardia, hemoptysis, asymmetric lower extremity edema no recent immobilization or surgery.  Patient is also not smoked in several decades does have some slight wheezing on exam with overall presentation more suggestive of mild COPD exacerbation at this time.  COVID PCR sent.  DuoNeb given in emergency room.  Given stable vitals with otherwise reassuring exam and work-up I believe patient is safe for discharge with plan for outpatient follow-up.  Rx written for short course of prednisone and doxycycline.  Impression is mild COPD exacerbation.    ____________________________________________   FINAL CLINICAL IMPRESSION(S) / ED DIAGNOSES  Final diagnoses:  COPD exacerbation (HCC)  Person under investigation for COVID-19  Chronic respiratory failure with hypoxia (HCC)    Medications  ipratropium-albuterol (DUONEB) 0.5-2.5 (3) MG/3ML nebulizer solution 3 mL (3 mLs Nebulization Given 04/15/20 2023)     ED Discharge Orders         Ordered    predniSONE (DELTASONE) 10 MG tablet  Daily        04/15/20 2020    doxycycline (VIBRAMYCIN) 100 MG capsule  2 times daily        04/15/20 2020           Note:  This document was prepared using Dragon voice recognition software and may include unintentional dictation errors.   Gilles Chiquito, MD 04/15/20 2038

## 2020-04-15 NOTE — ED Triage Notes (Signed)
Pt to ED POV for chief complaint of COPD exacerbation that started 2 days ago. Wears 2L [place] at home RR even, unlabored, speaking in complete sentences.  Denies cp

## 2020-04-16 LAB — SARS CORONAVIRUS 2 (TAT 6-24 HRS): SARS Coronavirus 2: NEGATIVE

## 2020-07-22 ENCOUNTER — Other Ambulatory Visit: Payer: Self-pay | Admitting: Specialist

## 2020-07-22 ENCOUNTER — Other Ambulatory Visit (HOSPITAL_COMMUNITY): Payer: Self-pay | Admitting: Specialist

## 2020-07-22 DIAGNOSIS — Z20822 Contact with and (suspected) exposure to covid-19: Secondary | ICD-10-CM

## 2020-07-22 DIAGNOSIS — R0602 Shortness of breath: Secondary | ICD-10-CM

## 2020-08-09 ENCOUNTER — Other Ambulatory Visit: Payer: Self-pay

## 2020-08-09 ENCOUNTER — Ambulatory Visit
Admission: RE | Admit: 2020-08-09 | Discharge: 2020-08-09 | Disposition: A | Discharge status: Home / Self Care | Payer: Medicare Other | Source: Ambulatory Visit | Attending: Specialist | Admitting: Specialist

## 2020-08-09 DIAGNOSIS — Z20822 Contact with and (suspected) exposure to covid-19: Secondary | ICD-10-CM | POA: Insufficient documentation

## 2020-08-09 DIAGNOSIS — R0602 Shortness of breath: Secondary | ICD-10-CM | POA: Diagnosis not present

## 2020-08-09 LAB — POCT I-STAT CREATININE: Creatinine, Ser: 0.8 mg/dL (ref 0.61–1.24)

## 2020-08-09 MED ORDER — IOHEXOL 300 MG/ML  SOLN
75.0000 mL | Freq: Once | INTRAMUSCULAR | Status: AC | PRN
  Administered 2020-08-09: 75 mL via INTRAVENOUS

## 2021-11-10 LAB — COLOGUARD: COLOGUARD: NEGATIVE

## 2022-02-07 IMAGING — CT CT CHEST W/ CM
2 of 4 series · 15 of 36 positions shown, 18 images · IV contrast (omnipaque)
Comparison: 06/18/2019, 07/25/2016

CLINICAL DATA: Persistent cough, rattling in chest, shortness of
breath, COPD, former smoker

EXAM:
CT CHEST WITH CONTRAST
TECHNIQUE: Multidetector CT imaging of the chest was performed during
intravenous contrast administration.
CONTRAST:  75mL OMNIPAQUE IOHEXOL 300 MG/ML  SOLN

[Series 2: axial chest 2.00 · axial · 0.69mm/px · z∈[-1214,-896]mm · 12 of 189 slices shown, 15 images]
[im 15/189  mediastinal]
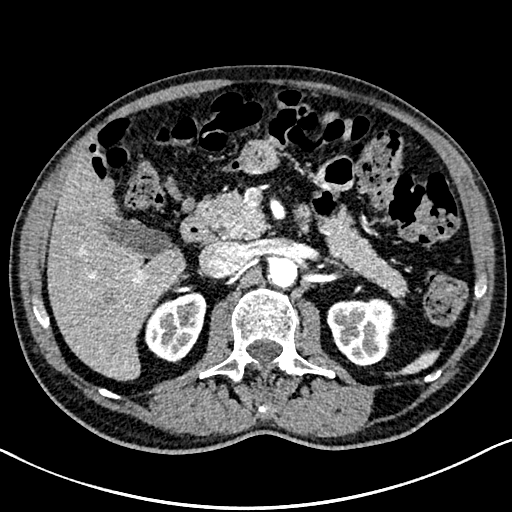
[im 15/189  lung]
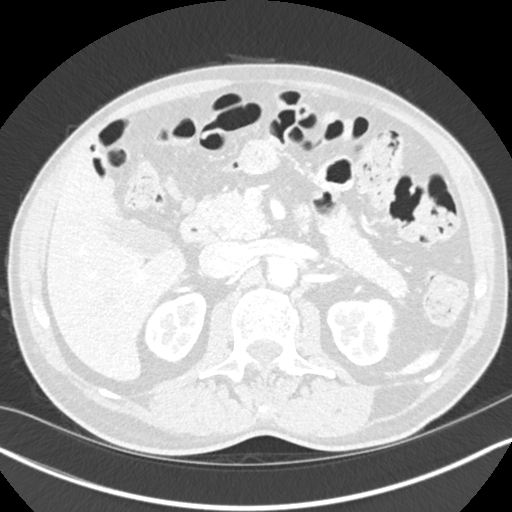
[im 29/189  lung]
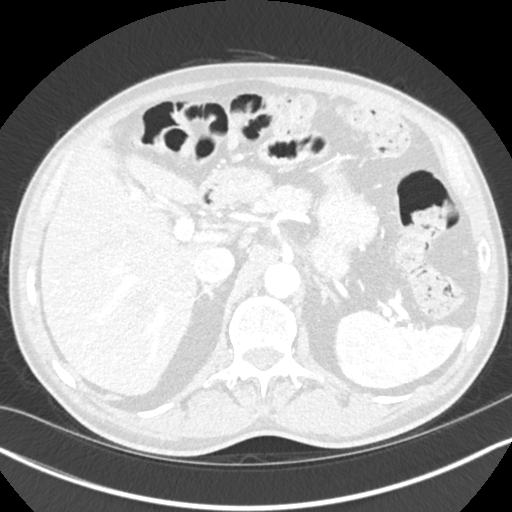
[im 44/189  lung]
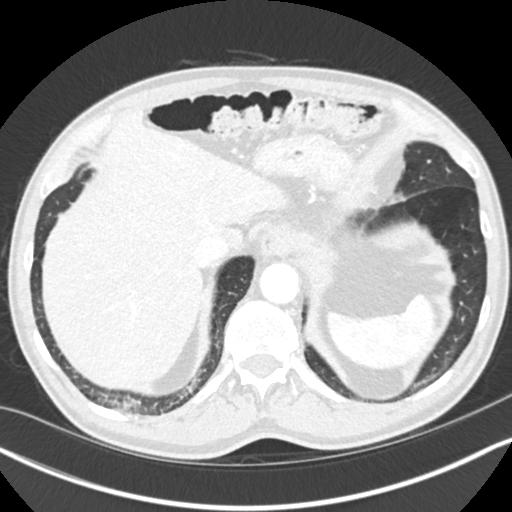
[im 58/189  lung]
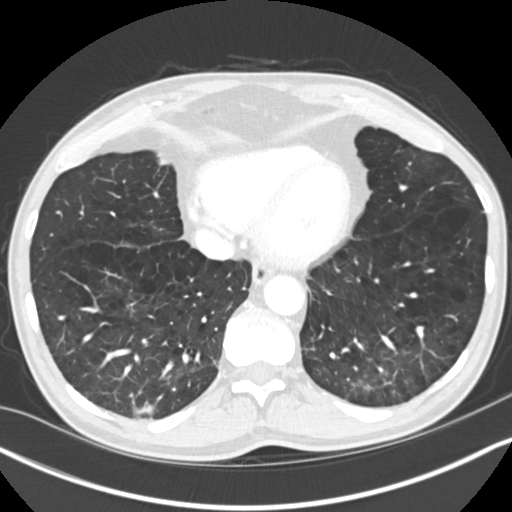
[im 73/189  mediastinal]
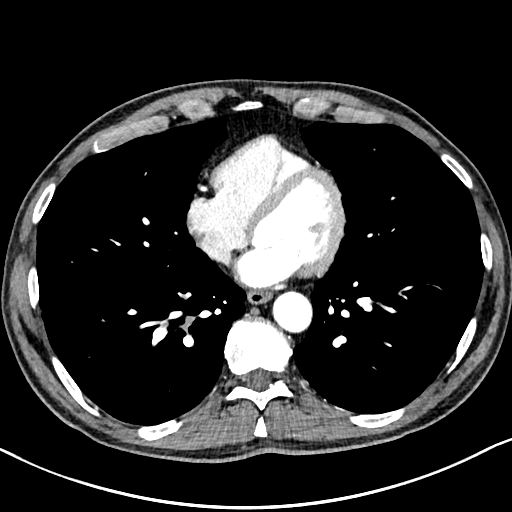
[im 73/189  lung]
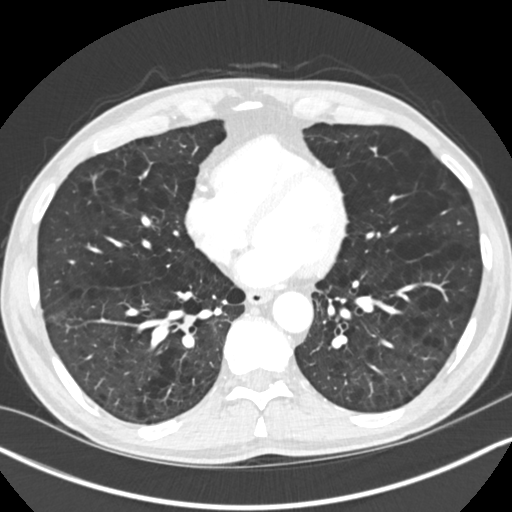
[im 87/189  lung]
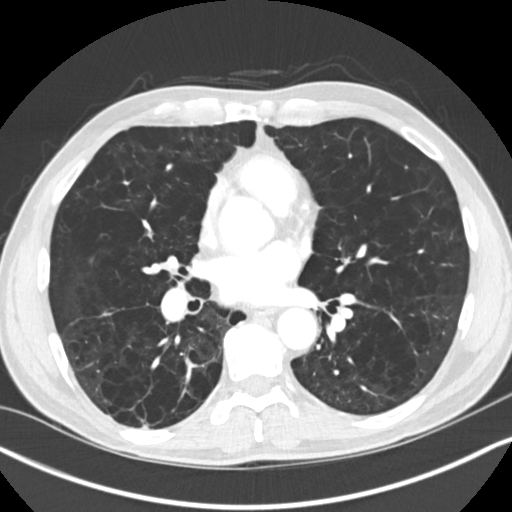
[im 102/189  lung]
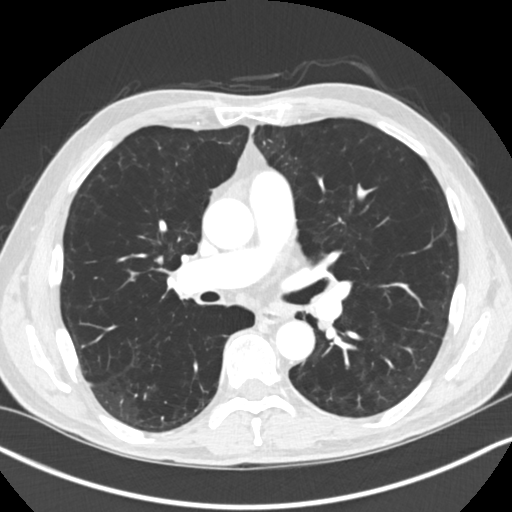
[im 116/189  lung]
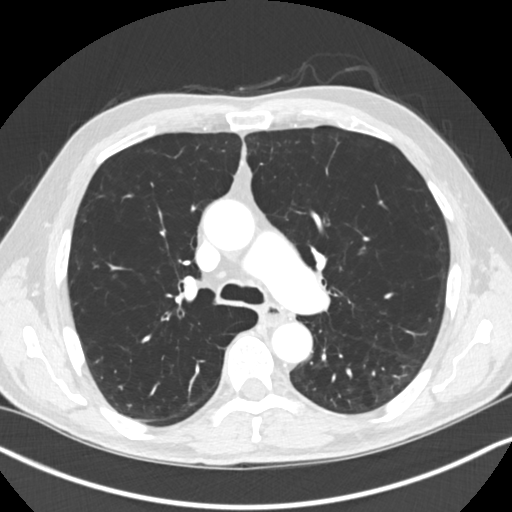
[im 131/189  mediastinal]
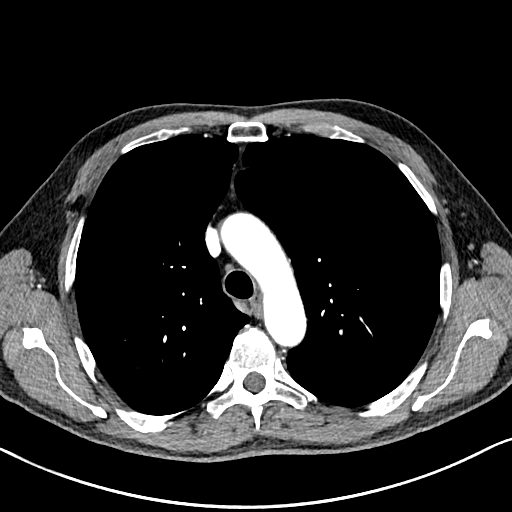
[im 131/189  lung]
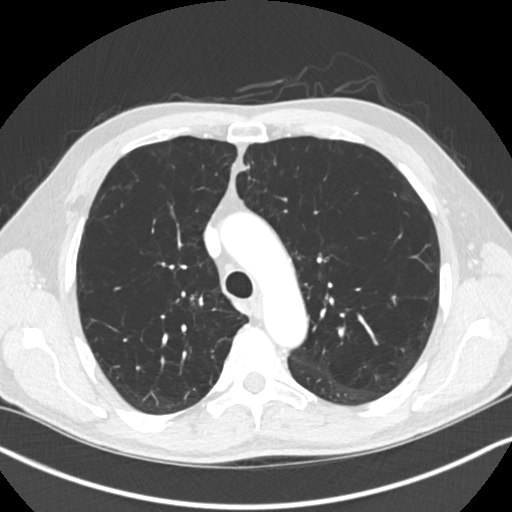
[im 145/189  lung]
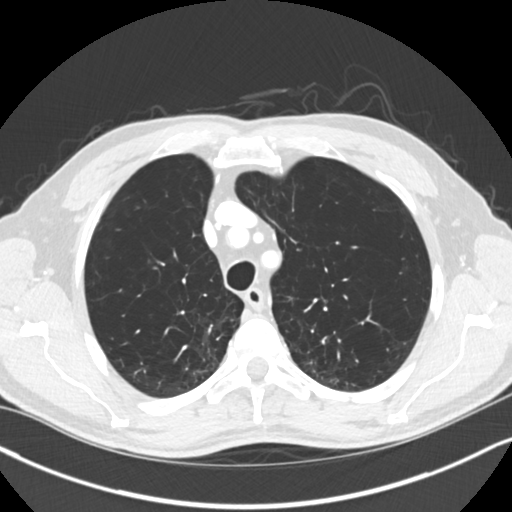
[im 160/189  lung]
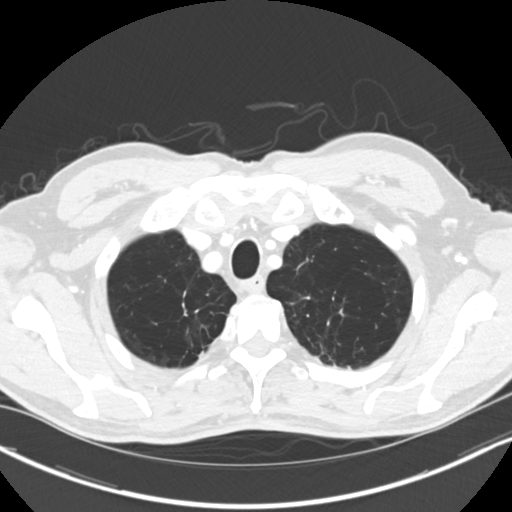
[im 174/189  lung]
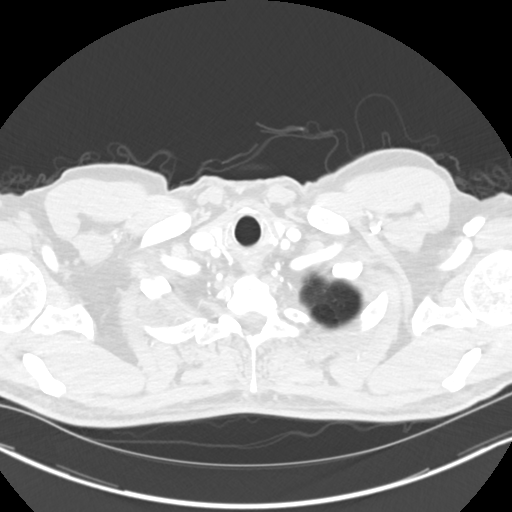

[Series 4: coronal chest 2.00 cor · coronal · 0.69mm/px · 3 of 140 slices shown]
[im 28/140  lung]
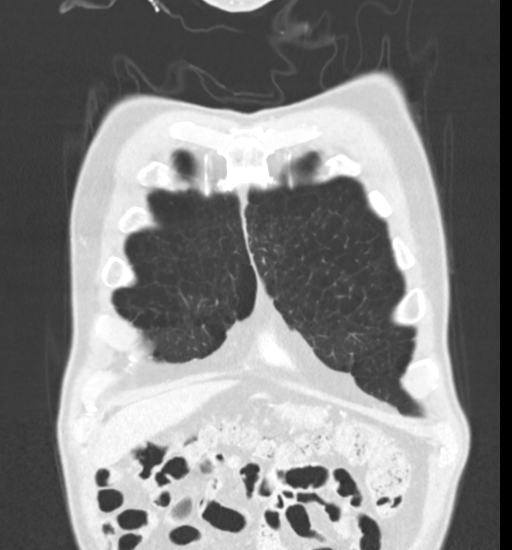
[im 56/140  lung]
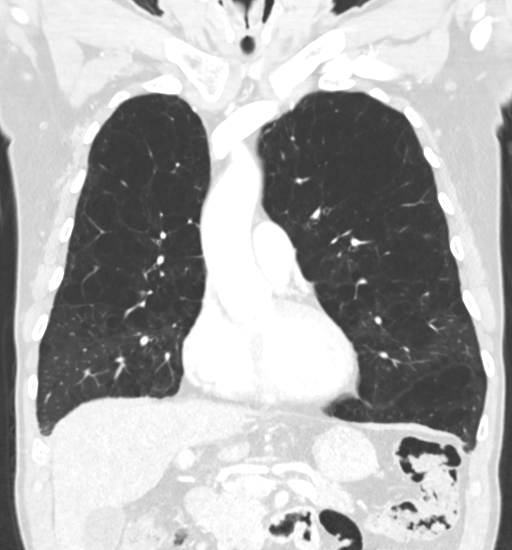
[im 84/140  lung]
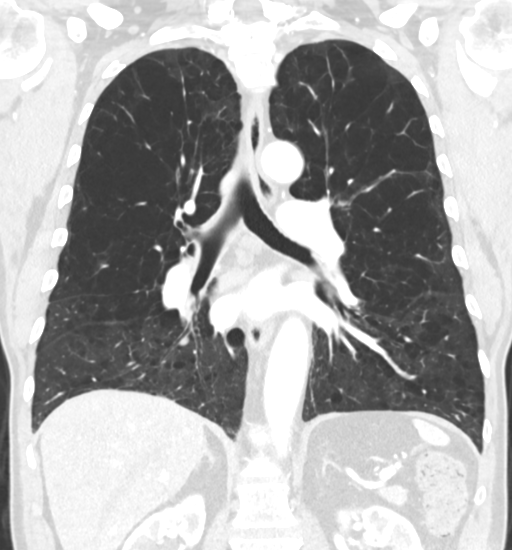

[15 of 36 positions shown; findings below may reference images not displayed]

FINDINGS: Cardiovascular: Scattered aortic atherosclerosis. Normal heart size.
No pericardial effusion.

Mediastinum/Nodes: No enlarged mediastinal, hilar, or axillary lymph
nodes. Small calcified mediastinal and right hilar lymph nodes.
Thyroid gland, trachea, and esophagus demonstrate no significant
findings.

Lungs/Pleura: Severe centrilobular emphysema. Diffuse bilateral
bronchial wall thickening. Bandlike scarring of the dependent right
lung base with a small nodule measuring 7 mm, stable compared to
multiple prior examinations and definitively benign (series 3, image
125). No pleural effusion or pneumothorax.

Upper Abdomen: No acute abnormality.

Musculoskeletal: No chest wall mass or suspicious bone lesions
identified.
IMPRESSION: 1. Severe emphysema.
2. Diffuse bilateral bronchial wall thickening, consistent with
nonspecific infectious or inflammatory bronchitis.

Aortic Atherosclerosis (15TD6-7AJ.J) and Emphysema (15TD6-ID2.N).

## 2022-06-07 ENCOUNTER — Ambulatory Visit (INDEPENDENT_AMBULATORY_CARE_PROVIDER_SITE_OTHER): Payer: Medicare Other | Admitting: Urology

## 2022-06-07 ENCOUNTER — Encounter: Payer: Self-pay | Admitting: Urology

## 2022-06-07 VITALS — BP 126/72 | HR 63 | Ht 68.0 in | Wt 142.0 lb

## 2022-06-07 DIAGNOSIS — N401 Enlarged prostate with lower urinary tract symptoms: Secondary | ICD-10-CM | POA: Diagnosis not present

## 2022-06-07 DIAGNOSIS — R339 Retention of urine, unspecified: Secondary | ICD-10-CM

## 2022-06-07 NOTE — Patient Instructions (Signed)

## 2022-06-07 NOTE — Progress Notes (Signed)
I, Jeanmarie Hubert Maxie,acting as a scribe for Hollice Espy, MD.,have documented all relevant documentation on the behalf of Hollice Espy, MD,as directed by  Hollice Espy, MD while in the presence of Hollice Espy, MD.   06/07/22 5:23 PM   Bryan Montoya [birth year] [id number]  Referring provider: Baxter Hire, MD [place],  [place] [address]  Chief Complaint  Patient presents with   Follow-up    HPI: 67 year-old male with personal history of BPH/chronic urinary retention/epididymitis who presents today for second opinion.  He is a self-referral.  He is seeking to transfer care more locally and is interested in HoLEP.  Records from Dr. Milford Cage are available in for review today.   He is being followed for urinary frequency and lower urinary tract symptoms. He's been having decreased frequency and weak stream progressive for quite some time. He has had normal PSA's. His PVR's has been markedly elevated, as high as 684 cc's. He is being managed on Silodosin. More recently he was started on Finasteride. Catheter placement was discussed but after shared decision making he declined. He has not had a cystoscopy or prostate volume measurement.  He has urinary frequency, weak stream, history of urinary tract infections, and epididymitis. No improvement with current medication regimen.  He has a history of COPD and is on 2-3 liters of oxygen.  Prostate sizing and cysto not yet performed.    PMH: Past Medical History:  Diagnosis Date   Arthritis    COPD (chronic obstructive pulmonary disease) ([place])    GERD (gastroesophageal reflux disease)     Surgical History: Past Surgical History:  Procedure Laterality Date   BACK SURGERY     x2    Home Medications:  Allergies as of 06/07/2022       Reactions   Hydrocodone Nausea And Vomiting   Hydrocodone Bit-homatrop Mbr Nausea Only, Nausea And Vomiting        Medication List        Accurate as of June 07, 2022   5:23 PM. If you have any questions, ask your nurse or doctor.          STOP taking these medications    fluticasone furoate-vilanterol 100-25 MCG/INH Aepb Commonly known as: BREO ELLIPTA Stopped by: Hollice Espy, MD   tiZANidine 4 MG tablet Commonly known as: ZANAFLEX Stopped by: Hollice Espy, MD       TAKE these medications    albuterol 108 (90 Base) MCG/ACT inhaler Commonly known as: VENTOLIN HFA Inhale 2 puffs into the lungs every 6 (six) hours as needed.   albuterol (2.5 MG/3ML) 0.083% nebulizer solution Commonly known as: PROVENTIL Inhale 3 mLs into the lungs 4 (four) times daily.   finasteride 5 MG tablet Commonly known as: PROSCAR Take 5 mg by mouth daily.   fluticasone 50 MCG/ACT nasal spray Commonly known as: FLONASE Place 2 sprays into both nostrils daily as needed for allergies.   gabapentin 300 MG capsule Commonly known as: NEURONTIN Take 1-3 tablets nightly   loratadine 10 MG tablet Commonly known as: CLARITIN Take 10 mg by mouth daily.   Melatonin 10 MG Tabs Take 1 tablet by mouth at bedtime   meloxicam 15 MG tablet Commonly known as: MOBIC Take 15 mg by mouth daily.   omeprazole 20 MG capsule Commonly known as: PRILOSEC Take 20 mg by mouth daily.   silodosin 8 MG Caps capsule Commonly known as: RAPAFLO Take by mouth.   Trelegy Ellipta 100-62.5-25 MCG/ACT Aepb  Generic drug: Fluticasone-Umeclidin-Vilant Inhale into the lungs.        Allergies:  Allergies  Allergen Reactions   Hydrocodone Nausea And Vomiting   Hydrocodone Bit-Homatrop Mbr Nausea Only and Nausea And Vomiting    Social History:  reports that he quit smoking about 16 years ago. His smoking use included cigarettes. He has a 30.00 pack-year smoking history. He has never used smokeless tobacco. He reports that he does not drink alcohol and does not use drugs.   Physical Exam: BP 126/72   Pulse 63   Ht '5\' 8"'$  (1.727 m)   Wt 142 lb (64.4 kg)   BMI 21.59 kg/m    Constitutional:  Alert and oriented, No acute distress. HEENT: [place] AT, moist mucus membranes.  Trachea midline, no masses. Neurologic: Grossly intact, no focal deficits, moving all 4 extremities. Psychiatric: Normal mood and affect.  Assessment & Plan:    Urinary retention - We discussed alternatives including TURP vs. holmium laser enucleation of the prostate vs. greenlight laser ablation. Differences between the surgical procedures were discussed as well as the risks and benefits of each. He is most interested in HoLEP.  We discussed the common postoperative course following holep including need for overnight Foley catheter, temporary worsening of irritative voiding symptoms, and occasional stress incontinence which typically lasts up to 6 months but can persist.  We discussed retrograde ejaculation and damage to surrounding structures including the urinary sphincter. Other uncommon complications including hematuria and urinary tract infection.  - Plan for cystoscopy and ultrasound to measure prostate volume to further evaluate and determine the appropriateness of the HoLEP procedure. Continue Finasteride and Silodosin until further evaluation.  He understands all of the above and is willing to proceed as planned.   Return for cysto/TRUS.  I have reviewed the above documentation for accuracy and completeness, and I agree with the above.   Hollice Espy, MD   [facility] [address], [place],  [address] [phone]

## 2022-07-11 ENCOUNTER — Telehealth: Payer: Self-pay

## 2022-07-11 ENCOUNTER — Other Ambulatory Visit: Payer: Self-pay | Admitting: Urology

## 2022-07-11 ENCOUNTER — Ambulatory Visit (INDEPENDENT_AMBULATORY_CARE_PROVIDER_SITE_OTHER): Payer: Medicare Other | Admitting: Urology

## 2022-07-11 ENCOUNTER — Encounter: Payer: Self-pay | Admitting: Urology

## 2022-07-11 VITALS — BP 114/65 | HR 54 | Ht 68.0 in | Wt 136.0 lb

## 2022-07-11 DIAGNOSIS — N138 Other obstructive and reflux uropathy: Secondary | ICD-10-CM

## 2022-07-11 DIAGNOSIS — R339 Retention of urine, unspecified: Secondary | ICD-10-CM | POA: Diagnosis not present

## 2022-07-11 DIAGNOSIS — N401 Enlarged prostate with lower urinary tract symptoms: Secondary | ICD-10-CM | POA: Diagnosis not present

## 2022-07-11 LAB — URINALYSIS, COMPLETE
Bilirubin, UA: NEGATIVE
Glucose, UA: NEGATIVE
Ketones, UA: NEGATIVE
Leukocytes,UA: NEGATIVE
Nitrite, UA: NEGATIVE
Protein,UA: NEGATIVE
RBC, UA: NEGATIVE
Specific Gravity, UA: 1.015 (ref 1.005–1.030)
Urobilinogen, Ur: 0.2 mg/dL (ref 0.2–1.0)
pH, UA: 5 (ref 5.0–7.5)

## 2022-07-11 LAB — MICROSCOPIC EXAMINATION: Bacteria, UA: NONE SEEN

## 2022-07-11 NOTE — Progress Notes (Signed)
07/11/22  Chief Complaint  Patient presents with   cysto/trust     HPI: 67 y.o. year-old male with refractory urinary symptoms related to BPH who presents today to the office for cystoscopy and prostate sizing.  He is in chronic urinary retention at this point in time.   Please see previous notes for details.     Blood pressure 114/65, pulse (!) 54, height 5\' 8"  (1.727 m), weight 136 lb (61.7 kg). NED. A&Ox3.   No respiratory distress   Abd soft, NT, ND Normal phallus with bilateral descended testicles    Cystoscopy Procedure Note  Patient identification was confirmed, informed consent was obtained, and patient was prepped using Betadine solution.  Lidocaine jelly was administered per urethral meatus.    Preoperative abx where received prior to procedure.     Pre-Procedure: - Inspection reveals a normal caliber ureteral meatus.  Procedure: The flexible cystoscope was introduced without difficulty - No urethral strictures/lesions are present. - Normal prostate  - Normal bladder neck - Bilateral ureteral orifices identified - Bladder mucosa  reveals no ulcers, tumors, or lesions - No bladder stones -Moderate trabeculation with a [place] diverticulum at dome  Retroflexion shows unremarkable   Post-Procedure: - Patient tolerated the procedure well   Prostate transrectal ultrasound sizing   Informed consent was obtained after discussing risks/benefits of the procedure.  A time out was performed to ensure correct patient identity.   Pre-Procedure: -Transrectal probe was placed without difficulty -Transrectal Ultrasound performed revealing a 26.4 gm prostate measuring 2.93 x 4.00 x 4.32 cm (length) -No significant hypoechoic or median lobe noted     Assessment/ Plan:  1. Benign prostatic hyperplasia with incomplete bladder emptying We had a lengthy discussion today discussing the cystoscopic and ultrasound findings  His prostate is relatively small and does  not have a particularly obstructive appearance that being said, he does have sequela of chronic outlet obstruction and urinary retention and thus likely does have a personal history of longstanding urinary obstruction.  We discussed the role of urodynamics to assess overall bladder capacity, function, and likelihood of successful intervention with an outlet procedure based on his overall bladder function.  It is possible that he has developed a neurogenic bladder.  Alternatively, we can move forward with an outlet procedure, specifically today discussed UroLift versus TURP.  His prostate is relatively small and does not necessitate HoLEP which would also be technically challenging for very small glands.  We discussed risk and benefits of each today.  He is leaning towards UroLift which is also favorable given the very quick recovery and can be done under sedation with his lung disease.  He is already been cleared by Dr. Meredeth Ide for anesthesia.  We discussed the risks of the procedure including failure to resolve his urinary retention/incomplete bladder emptying, bleeding, infection, dull pelvic pain, clip migration malfunction amongst others.  All questions were answered. - Urinalysis, Complete  2. Urinary retention As above - Urinalysis, Complete    [name], MD

## 2022-07-11 NOTE — Telephone Encounter (Signed)
I spoke with Mr. Bryan Montoya. We have discussed possible surgery dates and Monday April 15th, 2024 was agreed upon by all parties. Patient given information about surgery date, what to expect pre-operatively and post operatively.  We discussed that a Pre-Admission Testing office will be calling to set up the pre-op visit that will take place prior to surgery, and that these appointments are typically done over the phone with a Pre-Admissions RN. Informed patient that our office will communicate any additional care to be provided after surgery. Patients questions or concerns were discussed during our call. Advised to call our office should there be any additional information, questions or concerns that arise. Patient verbalized understanding.

## 2022-07-11 NOTE — Progress Notes (Signed)
Surgical Physician Order Form [facility] [place]  * Scheduling expectation : Next Available  *Length of Case:   *Clearance needed: yes; has already been cleared by pulmonology  *Anticoagulation Instructions: N/A  *Aspirin Instructions: Hold Aspirin  *Post-op visit Date/Instructions:  4-6 week w/PVR  *Diagnosis: BPH w/urinary obstruction  *Procedure:  UroLift     Additional orders: N/A  -Admit type: OUTpatient  -Anesthesia: MAC  -VTE Prophylaxis Standing Order SCD's       Other:   -Standing Lab Orders Per Anesthesia    Lab other: None  -Standing Test orders EKG/Chest x-ray per Anesthesia       Test other:   - Medications:  Ancef 2gm IV  -Other orders:  N/A

## 2022-07-11 NOTE — H&P (View-Only) (Signed)
07/11/22  Chief Complaint  Patient presents with   cysto/trust     HPI: 67 y.o. year-old male with refractory urinary symptoms related to BPH who presents today to the office for cystoscopy and prostate sizing.  He is in chronic urinary retention at this point in time.   Please see previous notes for details.     Blood pressure 114/65, pulse (!) 54, height 5' 8" (1.727 m), weight 136 lb (61.7 kg). NED. A&Ox3.   No respiratory distress   Abd soft, NT, ND Normal phallus with bilateral descended testicles    Cystoscopy Procedure Note  Patient identification was confirmed, informed consent was obtained, and patient was prepped using Betadine solution.  Lidocaine jelly was administered per urethral meatus.    Preoperative abx where received prior to procedure.     Pre-Procedure: - Inspection reveals a normal caliber ureteral meatus.  Procedure: The flexible cystoscope was introduced without difficulty - No urethral strictures/lesions are present. - Normal prostate  - Normal bladder neck - Bilateral ureteral orifices identified - Bladder mucosa  reveals no ulcers, tumors, or lesions - No bladder stones -Moderate trabeculation with a widemouth diverticulum at dome  Retroflexion shows unremarkable   Post-Procedure: - Patient tolerated the procedure well   Prostate transrectal ultrasound sizing   Informed consent was obtained after discussing risks/benefits of the procedure.  A time out was performed to ensure correct patient identity.   Pre-Procedure: -Transrectal probe was placed without difficulty -Transrectal Ultrasound performed revealing a 26.4 gm prostate measuring 2.93 x 4.00 x 4.32 cm (length) -No significant hypoechoic or median lobe noted     Assessment/ Plan:  1. Benign prostatic hyperplasia with incomplete bladder emptying We had a lengthy discussion today discussing the cystoscopic and ultrasound findings  His prostate is relatively small and does  not have a particularly obstructive appearance that being said, he does have sequela of chronic outlet obstruction and urinary retention and thus likely does have a personal history of longstanding urinary obstruction.  We discussed the role of urodynamics to assess overall bladder capacity, function, and likelihood of successful intervention with an outlet procedure based on his overall bladder function.  It is possible that he has developed a neurogenic bladder.  Alternatively, we can move forward with an outlet procedure, specifically today discussed UroLift versus TURP.  His prostate is relatively small and does not necessitate HoLEP which would also be technically challenging for very small glands.  We discussed risk and benefits of each today.  He is leaning towards UroLift which is also favorable given the very quick recovery and can be done under sedation with his lung disease.  He is already been cleared by Dr. Fleming for anesthesia.  We discussed the risks of the procedure including failure to resolve his urinary retention/incomplete bladder emptying, bleeding, infection, dull pelvic pain, clip migration malfunction amongst others.  All questions were answered. - Urinalysis, Complete  2. Urinary retention As above - Urinalysis, Complete    Elham Fini, MD     

## 2022-07-11 NOTE — Progress Notes (Signed)
   [facility] Surgical Posting Form  Surgery Date: Date: 07/17/2022  Surgeon: Dr. [name], MD  Inpt ( No  )   Outpt (Yes)   Obs ( No  )   Diagnosis: N40.1, N13.8 Benign Prostatic Hyperplasia with Urinary Obstruction  -CPT: 52441, [phone]  Surgery: Cystoscopy with Insertion of Urolift  Stop Anticoagulations: Yes, hold ASA  Cardiac/Medical/Pulmonary Clearance needed: no  *Orders entered into EPIC  Date: 07/11/22   *Case booked in [place]  Date: 07/11/22  *Notified pt of Surgery: Date: 07/11/22  PRE-OP UA & CX: yes, obtained in clinic today  *Placed into Prior Authorization Work Que Date: 07/11/22  Assistant/laser/rep:No

## 2022-07-13 ENCOUNTER — Other Ambulatory Visit: Payer: Self-pay

## 2022-07-13 ENCOUNTER — Encounter: Payer: Self-pay | Admitting: Urgent Care

## 2022-07-13 ENCOUNTER — Encounter
Admission: RE | Admit: 2022-07-13 | Discharge: 2022-07-13 | Disposition: A | Discharge status: Home / Self Care | Payer: Medicare Other | Source: Ambulatory Visit | Attending: Urology | Admitting: Urology

## 2022-07-13 VITALS — Ht 68.0 in | Wt 133.0 lb

## 2022-07-13 DIAGNOSIS — R001 Bradycardia, unspecified: Secondary | ICD-10-CM | POA: Insufficient documentation

## 2022-07-13 DIAGNOSIS — I5022 Chronic systolic (congestive) heart failure: Secondary | ICD-10-CM

## 2022-07-13 DIAGNOSIS — Z01818 Encounter for other preprocedural examination: Secondary | ICD-10-CM | POA: Insufficient documentation

## 2022-07-13 DIAGNOSIS — I491 Atrial premature depolarization: Secondary | ICD-10-CM | POA: Insufficient documentation

## 2022-07-13 DIAGNOSIS — Z01810 Encounter for preprocedural cardiovascular examination: Secondary | ICD-10-CM

## 2022-07-13 HISTORY — DX: Benign prostatic hyperplasia with lower urinary tract symptoms: N13.8

## 2022-07-13 HISTORY — DX: Emphysema, unspecified: J43.9

## 2022-07-13 HISTORY — DX: Dyspnea, unspecified: R06.00

## 2022-07-13 HISTORY — DX: Benign prostatic hyperplasia with lower urinary tract symptoms: N40.1

## 2022-07-13 HISTORY — DX: Dependence on supplemental oxygen: Z99.81

## 2022-07-13 HISTORY — DX: Atherosclerosis of aorta: I70.0

## 2022-07-13 LAB — CBC
HCT: 42.4 % (ref 39.0–52.0)
Hemoglobin: 13.9 g/dL (ref 13.0–17.0)
MCH: 29.7 pg (ref 26.0–34.0)
MCHC: 32.8 g/dL (ref 30.0–36.0)
MCV: 90.6 fL (ref 80.0–100.0)
Platelets: 240 10*3/uL (ref 150–400)
RBC: 4.68 MIL/uL (ref 4.22–5.81)
RDW: 13.4 % (ref 11.5–15.5)
WBC: 6.2 10*3/uL (ref 4.0–10.5)
nRBC: 0 % (ref 0.0–0.2)

## 2022-07-13 LAB — BASIC METABOLIC PANEL
Anion gap: 8 (ref 5–15)
BUN: 16 mg/dL (ref 8–23)
CO2: 26 mmol/L (ref 22–32)
Calcium: 8.9 mg/dL (ref 8.9–10.3)
Chloride: 102 mmol/L (ref 98–111)
Creatinine, Ser: 0.8 mg/dL (ref 0.61–1.24)
GFR, Estimated: >60 mL/min (ref >60)
Glucose, Bld: 81 mg/dL (ref 70–99)
Potassium: 4.2 mmol/L (ref 3.5–5.1)
Sodium: 136 mmol/L (ref 135–145)

## 2022-07-13 NOTE — Patient Instructions (Addendum)
Your procedure is scheduled on:  07/17/2022  Report to the Registration Desk on the 1st floor of the [facility].  To find out your arrival time, please call [phone] between 1PM - 3PM on: 07/13/2022   If your arrival time is 6:00 am, do not arrive before that time as the Medical Mall entrance doors do not open until 6:00 am.  REMEMBER: Instructions that are not followed completely may result in serious medical risk, up to and including death; or upon the discretion of your surgeon and anesthesiologist your surgery may need to be rescheduled.  Do not eat food after midnight the night before surgery.  No gum chewing or hard candies.  One week prior to surgery: Stop Anti-inflammatories (NSAIDS) such as Advil, Aleve, Ibuprofen, Motrin, Naproxen, Naprosyn and Aspirin based products such as Excedrin, Goody's Powder, BC Powder and meloxicam.  Stop ANY OVER THE COUNTER supplements until after surgery. You may however, continue to take Tylenol if needed for pain up until the day of surgery.  Continue taking all prescribed medications except meloxicam    TAKE ONLY THESE MEDICATIONS THE MORNING OF SURGERY WITH A SIP OF WATER:  Omeprazole (take one the night before and one on the morning of surgery - helps to prevent nausea after surgery.)  Silodosin   finasteride (PROSCAR)   Use inhaler on the day of surgery as prescribed and bring albuterol to the hospital.    No Alcohol for 24 hours before or after surgery.  No Smoking including e-cigarettes for 24 hours before surgery.  No chewable tobacco products for at least 6 hours before surgery.  No nicotine patches on the day of surgery.  Do not use any "recreational" drugs for at least a week (preferably 2 weeks) before your surgery.  Please be advised that the combination of cocaine and anesthesia may have negative outcomes, up to and including death. If you test positive for cocaine, your surgery will be cancelled.  On the morning of  surgery brush your teeth with toothpaste and water, you may rinse your mouth with mouthwash if you wish. Do not swallow any toothpaste or mouthwash.  You need to shower  on day of surgery.  Do not wear jewelry, make-up, hairpins, clips or nail polish.  Do not wear lotions, powders, or perfumes.   Do not shave body hair from the neck down 48 hours before surgery.  Contact lenses, hearing aids and dentures may not be worn into surgery.  Do not bring valuables to the hospital. [facility] is not responsible for any missing/lost belongings or valuables.    Notify your doctor if there is any change in your medical condition (cold, fever, infection).  Wear comfortable clothing (specific to your surgery type) to the hospital.  After surgery, you can help prevent lung complications by doing breathing exercises.  Take deep breaths and cough every 1-2 hours. Your doctor may order a device called an Incentive Spirometer to help you take deep breaths.    If you are being discharged the day of surgery, you will not be allowed to drive home. You will need a responsible individual to drive you home and stay with you for 24 hours after surgery.    Please call the Pre-admissions Testing Dept. at [phone] if you have any questions about these instructions.  Surgery Visitation Policy:  Patients having surgery or a procedure may have two visitors.  Children under the age of 25 must have an adult with them who is not  the patient.

## 2022-07-15 LAB — CULTURE, URINE COMPREHENSIVE

## 2022-07-16 MED ORDER — CEFAZOLIN SODIUM-DEXTROSE 2-4 GM/100ML-% IV SOLN
2.0000 g | INTRAVENOUS | Status: AC
  Administered 2022-07-17: 2 g via INTRAVENOUS

## 2022-07-16 MED ORDER — ORAL CARE MOUTH RINSE: 15.0000 mL | Freq: Once | OROMUCOSAL | Status: AC

## 2022-07-16 MED ORDER — CHLORHEXIDINE GLUCONATE 0.12 % MT SOLN
15.0000 mL | Freq: Once | OROMUCOSAL | Status: AC
  Administered 2022-07-17: 15 mL via OROMUCOSAL

## 2022-07-16 MED ORDER — LACTATED RINGERS IV SOLN: INTRAVENOUS | Status: DC

## 2022-07-17 ENCOUNTER — Ambulatory Visit
Admission: RE | Admit: 2022-07-17 | Discharge: 2022-07-17 | Disposition: A | Discharge status: Home / Self Care | Payer: Medicare Other | Attending: Urology | Admitting: Urology

## 2022-07-17 ENCOUNTER — Encounter: Payer: Self-pay | Admitting: Urology

## 2022-07-17 ENCOUNTER — Other Ambulatory Visit: Payer: Self-pay

## 2022-07-17 ENCOUNTER — Ambulatory Visit: Payer: Medicare Other | Admitting: Certified Registered"

## 2022-07-17 ENCOUNTER — Encounter
Admission: RE | Disposition: A | Discharge status: Home / Self Care | Payer: Self-pay | Source: Home / Self Care | Attending: Urology

## 2022-07-17 DIAGNOSIS — E782 Mixed hyperlipidemia: Secondary | ICD-10-CM | POA: Diagnosis not present

## 2022-07-17 DIAGNOSIS — Z87891 Personal history of nicotine dependence: Secondary | ICD-10-CM | POA: Insufficient documentation

## 2022-07-17 DIAGNOSIS — J449 Chronic obstructive pulmonary disease, unspecified: Secondary | ICD-10-CM | POA: Diagnosis not present

## 2022-07-17 DIAGNOSIS — Z9981 Dependence on supplemental oxygen: Secondary | ICD-10-CM | POA: Insufficient documentation

## 2022-07-17 DIAGNOSIS — I509 Heart failure, unspecified: Secondary | ICD-10-CM | POA: Diagnosis not present

## 2022-07-17 DIAGNOSIS — N401 Enlarged prostate with lower urinary tract symptoms: Secondary | ICD-10-CM | POA: Insufficient documentation

## 2022-07-17 DIAGNOSIS — I110 Hypertensive heart disease with heart failure: Secondary | ICD-10-CM | POA: Insufficient documentation

## 2022-07-17 DIAGNOSIS — K219 Gastro-esophageal reflux disease without esophagitis: Secondary | ICD-10-CM | POA: Diagnosis not present

## 2022-07-17 DIAGNOSIS — M1990 Unspecified osteoarthritis, unspecified site: Secondary | ICD-10-CM | POA: Diagnosis not present

## 2022-07-17 DIAGNOSIS — R338 Other retention of urine: Secondary | ICD-10-CM | POA: Insufficient documentation

## 2022-07-17 DIAGNOSIS — N138 Other obstructive and reflux uropathy: Secondary | ICD-10-CM | POA: Insufficient documentation

## 2022-07-17 HISTORY — PX: CYSTOSCOPY WITH INSERTION OF UROLIFT: SHX6678

## 2022-07-17 SURGERY — CYSTOSCOPY WITH INSERTION OF UROLIFT
Anesthesia: Monitor Anesthesia Care

## 2022-07-17 MED ORDER — OXYCODONE HCL 5 MG/5ML PO SOLN: 5.0000 mg | Freq: Once | ORAL | Status: DC | PRN

## 2022-07-17 MED ORDER — CHLORHEXIDINE GLUCONATE 0.12 % MT SOLN
OROMUCOSAL | Status: AC
  Filled 2022-07-17: qty 15

## 2022-07-17 MED ORDER — ONDANSETRON HCL 4 MG/2ML IJ SOLN: 4.0000 mg | Freq: Once | INTRAMUSCULAR | Status: DC | PRN

## 2022-07-17 MED ORDER — FENTANYL CITRATE (PF) 100 MCG/2ML IJ SOLN
INTRAMUSCULAR | Status: DC | PRN
  Administered 2022-07-17: 25 ug via INTRAVENOUS

## 2022-07-17 MED ORDER — LIDOCAINE HCL (CARDIAC) PF 100 MG/5ML IV SOSY
PREFILLED_SYRINGE | INTRAVENOUS | Status: DC | PRN
  Administered 2022-07-17: 40 mg via INTRAVENOUS

## 2022-07-17 MED ORDER — MIDAZOLAM HCL 2 MG/2ML IJ SOLN
INTRAMUSCULAR | Status: DC | PRN
  Administered 2022-07-17: 2 mg via INTRAVENOUS

## 2022-07-17 MED ORDER — PROPOFOL 500 MG/50ML IV EMUL
INTRAVENOUS | Status: DC | PRN
  Administered 2022-07-17: 180 ug/kg/min via INTRAVENOUS
  Administered 2022-07-17: 20 mg via INTRAVENOUS

## 2022-07-17 MED ORDER — LACTATED RINGERS IV SOLN: INTRAVENOUS | Status: DC

## 2022-07-17 MED ORDER — MIDAZOLAM HCL 2 MG/2ML IJ SOLN
INTRAMUSCULAR | Status: AC
  Filled 2022-07-17: qty 2

## 2022-07-17 MED ORDER — KETOROLAC TROMETHAMINE 30 MG/ML IJ SOLN
INTRAMUSCULAR | Status: DC | PRN
  Administered 2022-07-17: 15 mg via INTRAVENOUS

## 2022-07-17 MED ORDER — FENTANYL CITRATE (PF) 100 MCG/2ML IJ SOLN
INTRAMUSCULAR | Status: AC
  Filled 2022-07-17: qty 2

## 2022-07-17 MED ORDER — PROPOFOL 10 MG/ML IV BOLUS
INTRAVENOUS | Status: AC
  Filled 2022-07-17: qty 40

## 2022-07-17 MED ORDER — CEFAZOLIN SODIUM-DEXTROSE 2-4 GM/100ML-% IV SOLN
INTRAVENOUS | Status: AC
  Filled 2022-07-17: qty 100

## 2022-07-17 MED ORDER — OXYCODONE HCL 5 MG PO TABS: 5.0000 mg | ORAL_TABLET | Freq: Once | ORAL | Status: DC | PRN

## 2022-07-17 MED ORDER — FENTANYL CITRATE (PF) 100 MCG/2ML IJ SOLN: 25.0000 ug | INTRAMUSCULAR | Status: DC | PRN

## 2022-07-17 MED ORDER — LIDOCAINE HCL (PF) 2 % IJ SOLN
INTRAMUSCULAR | Status: AC
  Filled 2022-07-17: qty 5

## 2022-07-17 MED ORDER — DEXAMETHASONE SODIUM PHOSPHATE 10 MG/ML IJ SOLN
INTRAMUSCULAR | Status: AC
  Filled 2022-07-17: qty 1

## 2022-07-17 MED ORDER — KETOROLAC TROMETHAMINE 30 MG/ML IJ SOLN
INTRAMUSCULAR | Status: AC
  Filled 2022-07-17: qty 1

## 2022-07-17 MED ORDER — ACETAMINOPHEN 10 MG/ML IV SOLN: 1000.0000 mg | Freq: Once | INTRAVENOUS | Status: DC | PRN

## 2022-07-17 SURGICAL SUPPLY — 17 items
BAG DRAIN SIEMENS DORNER NS (MISCELLANEOUS) ×1 IMPLANT
BAG DRN NS LF (MISCELLANEOUS) ×1
GAUZE 4X4 16PLY ~~LOC~~+RFID DBL (SPONGE) ×2 IMPLANT
GLOVE BIO SURGEON STRL SZ 6.5 (GLOVE) ×1 IMPLANT
GOWN STRL REUS W/ TWL LRG LVL3 (GOWN DISPOSABLE) ×2 IMPLANT
GOWN STRL REUS W/TWL LRG LVL3 (GOWN DISPOSABLE) ×2
KIT TURNOVER CYSTO (KITS) ×1 IMPLANT
MANIFOLD NEPTUNE II (INSTRUMENTS) ×1 IMPLANT
PACK CYSTO AR (MISCELLANEOUS) ×1 IMPLANT
SET CYSTO W/LG BORE CLAMP LF (SET/KITS/TRAYS/PACK) ×1 IMPLANT
SURGILUBE 2OZ TUBE FLIPTOP (MISCELLANEOUS) IMPLANT
SYSTEM UROLIFT 2 CART W/ HNDL (Male Continence) ×1 IMPLANT
TRAP FLUID SMOKE EVACUATOR (MISCELLANEOUS) ×1 IMPLANT
UROLIFT IMPLANT
WATER STERILE IRR 1000ML POUR (IV SOLUTION) ×1 IMPLANT
WATER STERILE IRR 3000ML UROMA (IV SOLUTION) ×1 IMPLANT
WATER STERILE IRR 500ML POUR (IV SOLUTION) ×1 IMPLANT

## 2022-07-17 NOTE — Anesthesia Preprocedure Evaluation (Addendum)
Anesthesia Evaluation  Patient identified by MRN, date of birth, ID band Patient awake    Reviewed: Allergy & Precautions, NPO status , Patient's Chart, lab work & pertinent test results  History of Anesthesia Complications Negative for: history of anesthetic complications  Airway Mallampati: III   Neck ROM: Full    Dental  (+) Upper Dentures, Lower Dentures   Pulmonary COPD (2L O2 at night and as needed during day),  oxygen dependent, former smoker (quit 2007)   + rhonchi        Cardiovascular hypertension, +CHF  Normal cardiovascular exam Rhythm:Regular Rate:Normal  ECG 07/13/22:  Sinus bradycardia with Premature atrial complexes Otherwise normal ECG   Neuro/Psych negative neurological ROS     GI/Hepatic ,GERD  ,,  Endo/Other  negative endocrine ROS    Renal/GU negative Renal ROS     Musculoskeletal  (+) Arthritis ,    Abdominal   Peds  Hematology negative hematology ROS (+)   Anesthesia Other Findings Pulmonology note 07/14/22:  Impression/ Plan: Copd, severe- very severe obstruction, on oxygen, . His spiro has decreased.  Continue Trelegy one puff q day. albuterol Neb q 6 hours prn  02 to 3 liters with ambulation Daliresp 250 mg q day and increase to 500 mg /day if nausea is not an issue. ( did not start it as yet) F/u in 5 months  Rhinitis mild cough Flonase/claritin Singulair 10 mg q day ( he was off it, refilled)  Pre op clearance Pulmonary wise he is cleared for the holep procedure vs the TURP    Internal medicine note 04/13/22:  Assessment/Plan:   Benign essential HTN (primary encounter diagnosis) Chronic systolic HF (heart failure) (CMS-HCC) Chronic respiratory failure with hypoxia (CMS-HCC) Hyperlipidemia, mixed GERD without esophagitis Prostate cancer screening  Assessment and Plan  1. Hypertension. Well-controlled current medications. 2. Chronic CHF. No recent exacerbation. 3.  Chronic respiratory failure. He is on 2 L nasal cannula. 4. Hyperlipidemia. Continue statin. 5. GERD. Continue PPI as needed. 6. BPH. Continue current medications.    Reproductive/Obstetrics                             Anesthesia Physical Anesthesia Plan  ASA: 3  Anesthesia Plan: General   Post-op Pain Management:    Induction: Intravenous  PONV Risk Score and Plan: 2 and Propofol infusion, TIVA, Treatment may vary due to age or medical condition and Ondansetron  Airway Management Planned: Natural Airway  Additional Equipment:   Intra-op Plan:   Post-operative Plan:   Informed Consent: I have reviewed the patients History and Physical, chart, labs and discussed the procedure including the risks, benefits and alternatives for the proposed anesthesia with the patient or authorized representative who has indicated his/her understanding and acceptance.       Plan Discussed with: CRNA  Anesthesia Plan Comments: (LMA/GETA backup discussed.  Patient consented for risks of anesthesia including but not limited to:  - adverse reactions to medications - damage to eyes, teeth, lips or other oral mucosa - nerve damage due to positioning  - sore throat or hoarseness - damage to heart, brain, nerves, lungs, other parts of body or loss of life  Informed patient about role of CRNA in peri- and intra-operative care.  Patient voiced understanding.)        Anesthesia Quick Evaluation

## 2022-07-17 NOTE — Op Note (Signed)
Preoperative diagnosis: BPH with obstructive symptomatology   Postoperative diagnosis: BPH with obstructive symptomatology   Principal procedure: Urolift procedure, with the placement of 6 implants.   Surgeon: [name]   Anesthesia: MAC   Complications: None   Drains: None   Estimated blood loss: < 5 mL   Indications: 67 year-old male with obstructive symptomatology secondary to BPH.  The patient's symptoms have progressed, and he has requested further management.  Management options including TURP with resection/ablation of the prostate as well as Urolift were discussed.  The patient has chosen to have a Urolift procedure.  He has been instructed to the procedure as well as risks and complications which include but are not limited to infection, bleeding, and inadequate treatment with the Urolift procedure alone, anesthetic complications, among others.  He understands these and desires to proceed.   Findings: Using the 17 French cystoscope, urethra and bladder were inspected.  There were no urethral lesions.  Prostatic urethra was obstructed secondary to bilobar hypertrophy.  The bladder was inspected circumferentially.  This revealed normal findings.   Description of procedure: The patient was properly identified in the holding area.  He received preoperative IV antibiotics.  He was taken to the operating room where MAC.  He is placed in the dorsolithotomy position.  Genitalia and perineum were prepped and draped.  Proper timeout was performed.   A 59F cystoscope was inserted into the bladder with findings as described above.  The 1st pair of implants were placed at the bladder neck ~1.5 cm from the bladder Neck. The 2nd pair of implants were placed at the level of the verumontanum.   A repeat cysto was performed and another pair of implants in between the prior pairs but in a stacking type technique as the prostatic fossa was relatively short.   A final cystoscopy was conducted  first to inspect the location and state of each implant and second, to confirm the presence of a continuous anterior channel was present through the prostatic urethra with irrigation flow turned off.    6 implants were delivered in total.    Following this, the scope was removed.  After anesthetic reversal he was transported to the PACU in stable condition.  He tolerated the procedure well.   Plan: He will follow-up with me in 4 to 6 weeks for IPSS/PVR.

## 2022-07-17 NOTE — Transfer of Care (Signed)
Immediate Anesthesia Transfer of Care Note  Patient: Bryan Montoya  Procedure(s) Performed: CYSTOSCOPY WITH INSERTION OF UROLIFT  Patient Location: PACU  Anesthesia Type:General  Level of Consciousness: drowsy  Airway & Oxygen Therapy: Patient Spontanous Breathing and Patient connected to face mask oxygen  Post-op Assessment: Report given to RN and Post -op Vital signs reviewed and stable  Post vital signs: Reviewed and stable  Last Vitals:  Vitals Value Taken Time  BP 93/59 07/17/22 0940  Temp 36.6 C 07/17/22 0940  Pulse 51 07/17/22 0943  Resp 12 07/17/22 0943  SpO2 99 % 07/17/22 0943  Vitals shown include unvalidated device data.  Last Pain:  Vitals:   07/17/22 0802  TempSrc: Oral  PainSc: 0-No pain         Complications: No notable events documented.

## 2022-07-17 NOTE — Discharge Instructions (Addendum)
Urolift Post-Operative Instructions ? ? ?  ?Patient Expectations ? ? ?1. Mild blood in your urine for about 1 week. ? ?2. Urinary buring, frequency, and urgency for 10 days. ? ?3. Mild pelvic pain 1-2 weeks. ? ? ? ? ?Return to Activity ? ?  ? ?1. Drink water post procedure. ? ?2. Take meds as needed.  Tylenol and/or Motrin is most helpful.  You may also by Pyridium/Azo over-the-counter for urinary burning. ? ?3. No lifting or straining 48hrs. ? ?4. Other activity when they feel up to it. ? ? ?AMBULATORY SURGERY  ?DISCHARGE INSTRUCTIONS ? ? ?The drugs that you were given will stay in your system until tomorrow so for the next 24 hours you should not: ? ?Drive an automobile ?Make any legal decisions ?Drink any alcoholic beverage ? ? ?You may resume regular meals tomorrow.  Today it is better to start with liquids and gradually work up to solid foods. ? ?You may eat anything you prefer, but it is better to start with liquids, then soup and crackers, and gradually work up to solid foods. ? ? ?Please notify your doctor immediately if you have any unusual bleeding, trouble breathing, redness and pain at the surgery site, drainage, fever, or pain not relieved by medication. ? ? ? ?Additional Instructions: ? ? ?Please contact your physician with any problems or Same Day Surgery at [phone], Monday through Friday 6 am to 4 pm, or [place] [facility] number at [phone].  ?

## 2022-07-17 NOTE — Interval H&P Note (Signed)
History and Physical Interval Note:  07/17/2022 8:44 AM  Bryan Montoya  has presented today for surgery, with the diagnosis of Benign Prostatic Hyperplasia with Urinary Obstruction.  The various methods of treatment have been discussed with the patient and family. After consideration of risks, benefits and other options for treatment, the patient has consented to  Procedure(s): CYSTOSCOPY WITH INSERTION OF UROLIFT (N/A) as a surgical intervention.  The patient's history has been reviewed, patient examined, no change in status, stable for surgery.  I have reviewed the patient's chart and labs.  Questions were answered to the patient's satisfaction.    RRR CTAB  [name]

## 2022-07-18 ENCOUNTER — Ambulatory Visit (INDEPENDENT_AMBULATORY_CARE_PROVIDER_SITE_OTHER): Payer: Medicare Other | Admitting: Urology

## 2022-07-18 ENCOUNTER — Encounter: Payer: Self-pay | Admitting: Urology

## 2022-07-18 VITALS — BP 132/69 | HR 61 | Ht 68.0 in | Wt 135.0 lb

## 2022-07-18 DIAGNOSIS — R339 Retention of urine, unspecified: Secondary | ICD-10-CM

## 2022-07-18 LAB — BLADDER SCAN AMB NON-IMAGING: Scan Result: 210

## 2022-07-18 NOTE — Progress Notes (Signed)
07/18/2022 3:29 PM   Gray Bernhardt 10/24/1955 [id number]  Referring provider: Gracelyn Nurse, MD [address] [place],  [place] [address]  Urological history: 1. BPH with LU TS -PSA (10/2021) 0.95 -s/p UroLift (07/17/2022)  Chief Complaint  Patient presents with   Follow-up    HPI: Bryan Montoya is a 67 y.o. male who presents today for urinary dribbling.   He underwent a UroLift procedure yesterday.  His postoperative course is as expected and uneventful.  Today, he has experienced just urinary dribbling at times.  He states he is having some episodes of a good stream.  He has not had any issues with frank retention as of yet.  He has been having good fluid input.  Patient denies any modifying or aggravating factors.  Patient denies any gross hematuria, dysuria or suprapubic/flank pain.  Patient denies any fevers, chills, nausea or vomiting.    PVR 210 mL   PMH: Past Medical History:  Diagnosis Date   Aortic atherosclerosis    Arthritis    Benign prostatic hyperplasia with urinary obstruction    COPD (chronic obstructive pulmonary disease)    Dyspnea    Emphysema lung    GERD (gastroesophageal reflux disease)    Oxygen dependent    2 Liters resting , 2.5 Liters bedtime , 3 Liters with exertion.    Surgical History: Past Surgical History:  Procedure Laterality Date   BACK SURGERY     x2   CYSTOSCOPY WITH INSERTION OF UROLIFT N/A 07/17/2022   Procedure: CYSTOSCOPY WITH INSERTION OF UROLIFT;  Surgeon: [name], MD;  Location: [facility] ORS;  Service: Urology;  Laterality: N/A;    Home Medications:  Allergies as of 07/18/2022       Reactions   Hydrocodone Nausea And Vomiting   Hydrocodone Bit-homatrop Mbr Nausea Only, Nausea And Vomiting        Medication List        Accurate as of July 18, 2022  3:29 PM. If you have any questions, ask your nurse or doctor.          albuterol 108 (90 Base) MCG/ACT inhaler Commonly known as: VENTOLIN  HFA Inhale 2 puffs into the lungs every 6 (six) hours as needed.   albuterol (2.5 MG/3ML) 0.083% nebulizer solution Commonly known as: PROVENTIL Inhale 3 mLs into the lungs 4 (four) times daily.   finasteride 5 MG tablet Commonly known as: PROSCAR Take 5 mg by mouth daily.   gabapentin 300 MG capsule Commonly known as: NEURONTIN Take 1-3 tablets nightly   guaiFENesin 600 MG 12 hr tablet Commonly known as: MUCINEX Take 400 mg by mouth 2 (two) times daily.   loratadine 10 MG tablet Commonly known as: CLARITIN Take 10 mg by mouth daily.   Melatonin 10 MG Tabs Take 1 tablet by mouth at bedtime   meloxicam 15 MG tablet Commonly known as: MOBIC Take 15 mg by mouth daily.   omeprazole 20 MG capsule Commonly known as: PRILOSEC Take 20 mg by mouth daily.   roflumilast 500 MCG Tabs tablet Commonly known as: DALIRESP Take 250 mcg by mouth at bedtime.   silodosin 8 MG Caps capsule Commonly known as: RAPAFLO Take by mouth.   Trelegy Ellipta 100-62.5-25 MCG/ACT Aepb Generic drug: Fluticasone-Umeclidin-Vilant Inhale into the lungs.        Allergies:  Allergies  Allergen Reactions   Hydrocodone Nausea And Vomiting   Hydrocodone Bit-Homatrop Mbr Nausea Only and Nausea And Vomiting    Family  History: No family history on file.  Social History:  reports that he quit smoking about 16 years ago. His smoking use included cigarettes. He has a 30.00 pack-year smoking history. He has never used smokeless tobacco. He reports that he does not drink alcohol and does not use drugs.  ROS: Pertinent ROS in HPI  Physical Exam: BP 132/69   Pulse 61   Ht  (1.727 m)   Wt 135 lb (61.2 kg)   BMI 20.53 kg/m   Constitutional:  Well nourished. Alert and oriented, No acute distress. HEENT: [place] AT, moist mucus membranes.  Trachea midline, no masses. Cardiovascular: No clubbing, cyanosis, or edema. Respiratory: Normal respiratory effort, no increased work of breathing. GI:  Abdomen is soft, non tender, non distended, no abdominal masses. Liver and spleen not palpable.  No hernias appreciated.  Stool sample for occult testing is not indicated.   GU: No CVA tenderness.  No bladder fullness or masses.  Patient with circumcised phallus.  Urethral meatus is patent.  No penile discharge. No penile lesions or rashes. Scrotum without lesions, cysts, rashes and/or edema.   Neurologic: Grossly intact, no focal deficits, moving all 4 extremities. Psychiatric: Normal mood and affect.  Laboratory Data: Lab Results  Component Value Date   WBC 6.2 07/13/2022   HGB 13.9 07/13/2022   HCT 42.4 07/13/2022   MCV 90.6 07/13/2022   PLT 240 07/13/2022    Lab Results  Component Value Date   CREATININE 0.80 07/13/2022  I have reviewed the labs.   Pertinent Imaging:  07/18/22 14:21  Scan Result 210   Assessment & Plan:    1. Urinary retention -Explained that the symptoms can sometimes occur after UroLift are placed -Encouraged to keep good fluid intake -Bladder Scan (Post Void Residual) in office -Explained that he did not have a residual large enough to warrant Foley catheter placement, but that I would like to instruct him on self-catheterization in case he should run into issues during the night -He was agreeable for this and he was able to pass a 16 [place] coud straight male catheter without any difficulty and residual of 250 cc of clear yellow urine was obtained -I then gave him a box of samples for his use at home if he should require   Return for As scheduled.  These notes generated with voice recognition software. I apologize for typographical errors.  Cloretta Ned  [facility] [address]  [address] [place], [place] [address] [phone]  I spent 15 minutes on the day of the encounter to include pre-visit record review, face-to-face time with the patient, and post-visit ordering of tests.

## 2022-07-19 NOTE — Anesthesia Postprocedure Evaluation (Signed)
Anesthesia Post Note  Patient: Bryan Montoya  Procedure(s) Performed: CYSTOSCOPY WITH INSERTION OF UROLIFT  Patient location during evaluation: PACU Anesthesia Type: MAC Level of consciousness: awake and alert Pain management: pain level controlled Vital Signs Assessment: post-procedure vital signs reviewed and stable Respiratory status: spontaneous breathing, nonlabored ventilation, respiratory function stable and patient connected to nasal cannula oxygen Cardiovascular status: blood pressure returned to baseline and stable Postop Assessment: no apparent nausea or vomiting Anesthetic complications: no   No notable events documented.   Last Vitals:  Vitals:   07/17/22 1015 07/17/22 1019  BP: 134/79 122/62  Pulse: (!) 46 (!) 53  Resp: 11 20  Temp: (!) 36.1 C (!) 36.2 C  SpO2: 100% 100%    Last Pain:  Vitals:   07/18/22 1021  TempSrc:   PainSc: 0-No pain                 Lenard Simmer

## 2022-08-07 ENCOUNTER — Other Ambulatory Visit
Admission: RE | Admit: 2022-08-07 | Discharge: 2022-08-07 | Disposition: A | Discharge status: Home / Self Care | Payer: Medicare Other | Source: Ambulatory Visit | Attending: Family Medicine | Admitting: Family Medicine

## 2022-08-07 DIAGNOSIS — R634 Abnormal weight loss: Secondary | ICD-10-CM | POA: Insufficient documentation

## 2022-08-07 DIAGNOSIS — R058 Other specified cough: Secondary | ICD-10-CM | POA: Insufficient documentation

## 2022-08-07 LAB — CBC
HCT: 41.7 % (ref 39.0–52.0)
Hemoglobin: 13.5 g/dL (ref 13.0–17.0)
MCH: 29.7 pg (ref 26.0–34.0)
MCHC: 32.4 g/dL (ref 30.0–36.0)
MCV: 91.6 fL (ref 80.0–100.0)
Platelets: 380 10*3/uL (ref 150–400)
RBC: 4.55 MIL/uL (ref 4.22–5.81)
RDW: 13.1 % (ref 11.5–15.5)
WBC: 8.7 10*3/uL (ref 4.0–10.5)
nRBC: 0 % (ref 0.0–0.2)

## 2022-08-23 ENCOUNTER — Ambulatory Visit (INDEPENDENT_AMBULATORY_CARE_PROVIDER_SITE_OTHER): Payer: Medicare Other | Admitting: Urology

## 2022-08-23 VITALS — BP 116/66 | HR 56 | Ht 68.0 in | Wt 134.4 lb

## 2022-08-23 DIAGNOSIS — R339 Retention of urine, unspecified: Secondary | ICD-10-CM

## 2022-08-23 DIAGNOSIS — N401 Enlarged prostate with lower urinary tract symptoms: Secondary | ICD-10-CM

## 2022-08-23 LAB — BLADDER SCAN AMB NON-IMAGING: Scan Result: 224

## 2022-08-23 NOTE — Progress Notes (Signed)
Marcelle Overlie Plume,acting as a scribe for [name], MD.,have documented all relevant documentation on the behalf of [name], MD,as directed by  [name], MD while in the presence of [name], MD.  08/23/2022 11:18 AM   Bryan Montoya 08-03-1955 [id number]  Referring provider: Gracelyn Nurse, MD [address] [place],  [place] [address]  Chief Complaint  Patient presents with   Follow-up    HPI: 67 year-old male with a personal history of chronic urinary retention managed on finasteride and Rapaflo. He is status post UroLift on 07/17/2022. He returns today for follow up.   His PVR is significantly reduced down to 244 mL and was previously as high as the 600 range. Although this is still higher than the desired 100 mL or less, it represents a dramatic improvement. His IPSS is excellent.  Today, he notes a significant improvement in urinary flow and a reduction in nocturia. He is currently asymptomatic and has not required catheterization since the initial two weeks post-procedure.  Results for orders placed or performed in visit on 08/23/22  BLADDER SCAN AMB NON-IMAGING  Result Value Ref Range   Scan Result 224 ml     IPSS     Row Name 08/23/22 1000         International Prostate Symptom Score   How often have you had the sensation of not emptying your bladder? Not at All     How often have you had to urinate less than every two hours? Not at All     How often have you found you stopped and started again several times when you urinated? Not at All     How often have you found it difficult to postpone urination? Not at All     How often have you had a weak urinary stream? Not at All     How often have you had to strain to start urination? Not at All     How many times did you typically get up at night to urinate? 1 Time     Total IPSS Score 1       Quality of Life due to urinary symptoms   If you were to spend the rest of your life with your  urinary condition just the way it is now how would you feel about that? Delighted              Score:  1-7 Mild 8-19 Moderate 20-35 Severe    PMH: Past Medical History:  Diagnosis Date   Aortic atherosclerosis (HCC)    Arthritis    Benign prostatic hyperplasia with urinary obstruction    COPD (chronic obstructive pulmonary disease) (HCC)    Dyspnea    Emphysema lung (HCC)    GERD (gastroesophageal reflux disease)    Oxygen dependent    2 Liters resting , 2.5 Liters bedtime , 3 Liters with exertion.    Surgical History: Past Surgical History:  Procedure Laterality Date   BACK SURGERY     x2   CYSTOSCOPY WITH INSERTION OF UROLIFT N/A 07/17/2022   Procedure: CYSTOSCOPY WITH INSERTION OF UROLIFT;  Surgeon: [name], MD;  Location: [facility] ORS;  Service: Urology;  Laterality: N/A;    Home Medications:  Allergies as of 08/23/2022       Reactions   Hydrocodone Nausea And Vomiting   Hydrocodone Bit-homatrop Mbr Nausea Only, Nausea And Vomiting        Medication List  Accurate as of Aug 23, 2022 11:18 AM. If you have any questions, ask your nurse or doctor.          albuterol 108 (90 Base) MCG/ACT inhaler Commonly known as: VENTOLIN HFA Inhale 2 puffs into the lungs every 6 (six) hours as needed.   albuterol (2.5 MG/3ML) 0.083% nebulizer solution Commonly known as: PROVENTIL Inhale 3 mLs into the lungs 4 (four) times daily.   cefUROXime 500 MG tablet Commonly known as: CEFTIN Take by mouth.   finasteride 5 MG tablet Commonly known as: PROSCAR Take 5 mg by mouth daily.   gabapentin 300 MG capsule Commonly known as: NEURONTIN Take 1-3 tablets nightly   guaiFENesin 600 MG 12 hr tablet Commonly known as: MUCINEX Take 400 mg by mouth 2 (two) times daily.   loratadine 10 MG tablet Commonly known as: CLARITIN Take 10 mg by mouth daily.   Melatonin 10 MG Tabs Take 1 tablet by mouth at bedtime   meloxicam 15 MG tablet Commonly known as:  MOBIC Take 15 mg by mouth daily.   omeprazole 20 MG capsule Commonly known as: PRILOSEC Take 20 mg by mouth daily.   roflumilast 500 MCG Tabs tablet Commonly known as: DALIRESP Take 250 mcg by mouth at bedtime.   silodosin 8 MG Caps capsule Commonly known as: RAPAFLO Take by mouth.   Trelegy Ellipta 100-62.5-25 MCG/ACT Aepb Generic drug: Fluticasone-Umeclidin-Vilant Inhale into the lungs.        Allergies:  Allergies  Allergen Reactions   Hydrocodone Nausea And Vomiting   Hydrocodone Bit-Homatrop Mbr Nausea Only and Nausea And Vomiting    Social History:  reports that he quit smoking about 16 years ago. His smoking use included cigarettes. He has a 30.00 pack-year smoking history. He has never used smokeless tobacco. He reports that he does not drink alcohol and does not use drugs.   Physical Exam: BP 116/66   Pulse (!) 56   Ht 5\' 8"  (1.727 m)   Wt 134 lb 6.4 oz (61 kg)   BMI 20.44 kg/m   Constitutional:  Alert and oriented, No acute distress. HEENT: [place] AT, moist mucus membranes.  Trachea midline, no masses. Neurologic: Grossly intact, no focal deficits, moving all 4 extremities. Psychiatric: Normal mood and affect.   Assessment & Plan:    1. BPH with incomplete bladder emptying/ history of chronic urinary retention - dramatic improvement in urinary symptoms post-procedure. - Although PVR is elevated, it has decreased significantly and he is thrilled - Discussed the possibility of titrating off medications. The plan is to maintain finasteride due to its long-term benefits and minimal side effects. - Consider discontinuing Rapaflo to see if symptoms remain stable. If symptoms worsen after about a week, he can resume the medication. If his symptoms remain stable, he is okay to stop this medication. He agrees with this plan.  Return in about 6 months (around 02/23/2023) for visit with a PA for IPSS and PVR.  I have reviewed the above documentation for accuracy and  completeness, and I agree with the above.   [name], MD   [facility] [address] [place], [place] [address] [phone]

## 2022-09-08 ENCOUNTER — Other Ambulatory Visit: Payer: Self-pay | Admitting: Specialist

## 2022-09-08 DIAGNOSIS — R918 Other nonspecific abnormal finding of lung field: Secondary | ICD-10-CM

## 2022-09-08 DIAGNOSIS — R053 Chronic cough: Secondary | ICD-10-CM

## 2022-09-14 ENCOUNTER — Ambulatory Visit: Payer: Medicare Other

## 2022-11-03 ENCOUNTER — Other Ambulatory Visit: Payer: Self-pay | Admitting: Specialist

## 2022-11-03 DIAGNOSIS — R918 Other nonspecific abnormal finding of lung field: Secondary | ICD-10-CM

## 2022-11-10 ENCOUNTER — Ambulatory Visit
Admission: RE | Admit: 2022-11-10 | Discharge: 2022-11-10 | Disposition: A | Discharge status: Home / Self Care | Payer: Medicare Other | Source: Ambulatory Visit | Attending: Specialist | Admitting: Specialist

## 2022-11-10 DIAGNOSIS — R918 Other nonspecific abnormal finding of lung field: Secondary | ICD-10-CM | POA: Insufficient documentation

## 2023-02-20 NOTE — Progress Notes (Unsigned)
02/22/2023 12:30 PM   Bryan Montoya 1955-10-03 [id number]  Referring provider: Gracelyn Nurse, MD [address] [facility] [place],  [place] [address]  Urological history: 1. BPH with LU TS -PSA (10/2022) 0.46 -s/p UroLift (07/17/2022)  No chief complaint on file.  HPI: Bryan Montoya is a 67 y.o. male who presents today for follow up.   Previous records reviewed.   I PSS ***    Score:  1-7 Mild 8-19 Moderate 20-35 Severe   PMH: Past Medical History:  Diagnosis Date   Aortic atherosclerosis (HCC)    Arthritis    Benign prostatic hyperplasia with urinary obstruction    COPD (chronic obstructive pulmonary disease) (HCC)    Dyspnea    Emphysema lung (HCC)    GERD (gastroesophageal reflux disease)    Oxygen dependent    2 Liters resting , 2.5 Liters bedtime , 3 Liters with exertion.    Surgical History: Past Surgical History:  Procedure Laterality Date   BACK SURGERY     x2   CYSTOSCOPY WITH INSERTION OF UROLIFT N/A 07/17/2022   Procedure: CYSTOSCOPY WITH INSERTION OF UROLIFT;  Surgeon: [name], MD;  Location: [facility] OR[facility];  Service: Urology;  Laterality: N/A;    Home Medications:  Allergies as of 02/22/2023       Reactions   Hydrocodone Nausea And Vomiting   Hydrocodone Bit-homatrop Mbr Nausea Only, Nausea And Vomiting        Medication List        Accurate as of February 20, 2023 12:30 PM. If you have any questions, ask your nurse or doctor.          albuterol 108 (90 Base) MCG/ACT inhaler Commonly known as: VENTOLIN HFA Inhale 2 puffs into the lungs every 6 (six) hours as needed.   albuterol (2.5 MG/3ML) 0.083% nebulizer solution Commonly known as: PROVENTIL Inhale 3 mLs into the lungs 4 (four) times daily.   finasteride 5 MG tablet Commonly known as: PROSCAR Take 5 mg by mouth daily.   gabapentin 300 MG capsule Commonly known as: NEURONTIN Take 1-3 tablets nightly   guaiFENesin 600 MG 12 hr tablet Commonly  known as: MUCINEX Take 400 mg by mouth 2 (two) times daily.   loratadine 10 MG tablet Commonly known as: CLARITIN Take 10 mg by mouth daily.   Melatonin 10 MG Tabs Take 1 tablet by mouth at bedtime   meloxicam 15 MG tablet Commonly known as: MOBIC Take 15 mg by mouth daily.   omeprazole 20 MG capsule Commonly known as: PRILOSEC Take 20 mg by mouth daily.   roflumilast 500 MCG Tabs tablet Commonly known as: DALIRESP Take 250 mcg by mouth at bedtime.   silodosin 8 MG Caps capsule Commonly known as: RAPAFLO Take by mouth.   Trelegy Ellipta 100-62.5-25 MCG/ACT Aepb Generic drug: Fluticasone-Umeclidin-Vilant Inhale into the lungs.        Allergies:  Allergies  Allergen Reactions   Hydrocodone Nausea And Vomiting   Hydrocodone Bit-Homatrop Mbr Nausea Only and Nausea And Vomiting    Family History: No family history on file.  Social History:  reports that he quit smoking about 17 years ago. His smoking use included cigarettes. He started smoking about 47 years ago. He has a 30 pack-year smoking history. He has never used smokeless tobacco. He reports that he does not drink alcohol and does not use drugs.  ROS: Pertinent ROS in HPI  Physical Exam: There were no vitals taken for this visit.  Constitutional:  Well nourished. Alert and oriented, No acute distress. HEENT: [place] AT, moist mucus membranes.  Trachea midline, no masses. Cardiovascular: No clubbing, cyanosis, or edema. Respiratory: Normal respiratory effort, no increased work of breathing. GI: Abdomen is soft, non tender, non distended, no abdominal masses. Liver and spleen not palpable.  No hernias appreciated.  Stool sample for occult testing is not indicated.   GU: No CVA tenderness.  No bladder fullness or masses.  Patient with circumcised phallus.  Urethral meatus is patent.  No penile discharge. No penile lesions or rashes. Scrotum without lesions, cysts, rashes and/or edema.   Neurologic: Grossly intact,  no focal deficits, moving all 4 extremities. Psychiatric: Normal mood and affect.  Laboratory Data: Comprehensive Metabolic Panel (CMP) Order: [id number] Component Ref Range & Units 4 mo ago  Glucose 70 - 110 mg/dL 76  Sodium 409 - 811 mmol/L 142  Potassium 3.6 - 5.1 mmol/L 4.5  Chloride 97 - 109 mmol/L 108  Carbon Dioxide (CO2) 22.0 - 32.0 mmol/L 26.0  Urea Nitrogen (BUN) 7 - 25 mg/dL 20  Creatinine 0.7 - 1.3 mg/dL 0.8  Glomerular Filtration Rate (eGFR) >60 mL/min/1.73sq m 96  Comment: CKD-EPI (2021) does not include patient's race in the calculation of eGFR.  Monitoring changes of plasma creatinine and eGFR over time is useful for monitoring kidney function.  Interpretive Ranges for eGFR (CKD-EPI 2021):  eGFR:       >60 mL/min/1.73 sq. m - Normal eGFR:       30-59 mL/min/1.73 sq. m - Moderately Decreased eGFR:       15-29 mL/min/1.73 sq. m  - Severely Decreased eGFR:       < 15 mL/min/1.73 sq. m  - Kidney Failure   Note: These eGFR calculations do not apply in acute situations when eGFR is changing rapidly or patients on dialysis.  Calcium 8.7 - 10.3 mg/dL 8.8  AST 8 - 39 U/L 19  ALT 6 - 57 U/L 13  Alk Phos (alkaline Phosphatase) 34 - 104 U/L 82  Albumin 3.5 - 4.8 g/dL 3.6  Bilirubin, Total 0.3 - 1.2 mg/dL 0.6  Protein, Total 6.1 - 7.9 g/dL 6.1  A/G Ratio 1.0 - 5.0 gm/dL 1.4  Resulting Agency [facility]   Specimen Collected: 10/06/22 07:17   Performed by: [facility] Last Resulted: 10/06/22 10:27  Received From: [facility]  Result Received: 11/03/22 10:01   CBC w/auto Differential (5 Part) Order: [id number] Component Ref Range & Units 4 mo ago  WBC (White Blood Cell Count) 4.1 - 10.2 10^3/uL 6.7  RBC (Red Blood Cell Count) 4.69 - 6.13 10^6/uL 4.80  Hemoglobin 14.1 - 18.1 gm/dL 21.3 Low   Hematocrit 08.6 - 52.0 % 42.5  MCV (Mean Corpuscular Volume) 80.0 - 100.0 fl 88.5  MCH (Mean Corpuscular  Hemoglobin) 27.0 - 31.2 pg 28.8  MCHC (Mean Corpuscular Hemoglobin Concentration) 32.0 - 36.0 gm/dL 57.8  Platelet Count 469 - 450 10^3/uL 349  RDW-CV (Red Cell Distribution Width) 11.6 - 14.8 % 13.9  MPV (Mean Platelet Volume) 9.4 - 12.4 fl 8.9 Low   Neutrophils 1.50 - 7.80 10^3/uL 4.71  Lymphocytes 1.00 - 3.60 10^3/uL 1.05  Monocytes 0.00 - 1.50 10^3/uL 0.64  Eosinophils 0.00 - 0.55 10^3/uL 0.23  Basophils 0.00 - 0.09 10^3/uL 0.05  Neutrophil % 32.0 - 70.0 % 70.4 High   Lymphocyte % 10.0 - 50.0 % 15.6  Monocyte % 4.0 - 13.0 % 9.5  Eosinophil %  1.0 - 5.0 % 3.4  Basophil% 0.0 - 2.0 % 0.7  Immature Granulocyte % <=0.7 % 0.4  Immature Granulocyte Count <=0.06 10^3/L 0.03  Resulting Agency [facility]   Specimen Collected: 10/06/22 07:17   Performed by: [facility] Last Resulted: 10/06/22 12:29  Received From: [facility]  Result Received: 11/03/22 10:01   Lipid Panel w/calc LDL Order: [id number] Component Ref Range & Units 4 mo ago  Cholesterol, Total 100 - 200 mg/dL 782  Triglyceride 35 - 199 mg/dL 70  HDL (High Density Lipoprotein) Cholesterol 29.0 - 71.0 mg/dL 95.6  LDL Calculated 0 - 130 mg/dL 213  VLDL Cholesterol mg/dL 14  Cholesterol/HDL Ratio 3.7  Resulting Agency [facility]   Specimen Collected: 10/06/22 07:17   Performed by: [facility] Last Resulted: 10/06/22 15:27  Received From: [facility]  Result Received: 11/03/22 10:01   PSA, Total (Screen) Order: [id number] Component Ref Range & Units 4 mo ago  PSA (Prostate Specific Antigen), Total 0.10 - 4.00 ng/mL 0.46  Resulting Agency [facility]  Narrative Performed by [facility] Test results were determined with Beckman Coulter Hybritech Assay. Values obtained with different assay methods cannot be used interchangeably in serial testing. Assay results should not be  interpreted as absolute evidence of the presence or absence of malignant disease  Specimen Collected: 10/06/22 07:17   Performed by: [facility] Last Resulted: 10/06/22 15:38  Received From: [facility]  Result Received: 11/03/22 10:01    Urinalysis w/Microscopic Order: [id number] Component Ref Range & Units 4 mo ago  Color Colorless, Straw, Light Yellow, Yellow, Dark Yellow Light Yellow  Clarity Clear Clear  Specific Gravity 1.005 - 1.030 1.014  pH, Urine 5.0 - 8.0 5.5  Protein, Urinalysis Negative mg/dL Negative  Glucose, Urinalysis Negative mg/dL Negative  Ketones, Urinalysis Negative mg/dL Negative  Blood, Urinalysis Negative Negative  Nitrite, Urinalysis Negative Negative  Leukocyte Esterase, Urinalysis Negative Negative  Bilirubin, Urinalysis Negative Negative  Urobilinogen, Urinalysis 0.2 - 1.0 mg/dL 0.2  WBC, UA <=5 /hpf 1  Red Blood Cells, Urinalysis <=3 /hpf 1  Bacteria, Urinalysis 0 - 5 /hpf 0-5  Squamous Epithelial Cells, Urinalysis /hpf 0  Resulting Agency [facility]   Specimen Collected: 10/06/22 07:17   Performed by: [facility] Last Resulted: 10/06/22 08:21  Received From: [facility]  Result Received: 11/03/22 10:01  I have reviewed the labs.   Pertinent Imaging: ***  Assessment & Plan:    1. Urinary retention -PVR demonstrate adequate emptying  2. BPH with LUTS -PSA screening up to date  -PVR < 300 cc *** -s/p UroLift (07/2022) -symptoms - *** -most bothersome symptoms are *** -continue conservative management, avoiding bladder irritants and timed voiding's -Continue tamsulosin 0.4 mg daily, alfuzosin 10 mg daily, Rapaflo 8 mg daily, terazosin, doxazosin, Cialis 5 mg daily and finasteride 5 mg daily, dutasteride 0.5 mg daily***:refills given  No follow-ups on file.  These notes generated with voice recognition software. I apologize for typographical  errors.  Cloretta Ned  [facility] [address]  [address] [place], [place] [address] [phone]

## 2023-02-22 ENCOUNTER — Encounter: Payer: Self-pay | Admitting: Urology

## 2023-02-22 ENCOUNTER — Ambulatory Visit (INDEPENDENT_AMBULATORY_CARE_PROVIDER_SITE_OTHER): Payer: Medicare Other | Admitting: Urology

## 2023-02-22 DIAGNOSIS — N401 Enlarged prostate with lower urinary tract symptoms: Secondary | ICD-10-CM | POA: Diagnosis not present

## 2023-02-22 DIAGNOSIS — R339 Retention of urine, unspecified: Secondary | ICD-10-CM | POA: Diagnosis not present

## 2023-02-22 DIAGNOSIS — N138 Other obstructive and reflux uropathy: Secondary | ICD-10-CM

## 2023-02-22 LAB — BLADDER SCAN AMB NON-IMAGING: Scan Result: 300

## 2023-02-22 MED ORDER — FINASTERIDE 5 MG PO TABS
5.0000 mg | ORAL_TABLET | Freq: Every day | ORAL | 3 refills | Status: AC
Start: 2023-02-22 — End: ?

## 2023-02-27 ENCOUNTER — Other Ambulatory Visit: Payer: Self-pay | Admitting: Specialist

## 2023-02-27 DIAGNOSIS — R9389 Abnormal findings on diagnostic imaging of other specified body structures: Secondary | ICD-10-CM

## 2023-02-27 DIAGNOSIS — J449 Chronic obstructive pulmonary disease, unspecified: Secondary | ICD-10-CM

## 2023-03-09 ENCOUNTER — Ambulatory Visit
Admission: RE | Admit: 2023-03-09 | Discharge: 2023-03-09 | Disposition: A | Discharge status: Home / Self Care | Payer: Medicare Other | Source: Ambulatory Visit | Attending: Specialist | Admitting: Specialist

## 2023-03-09 DIAGNOSIS — J449 Chronic obstructive pulmonary disease, unspecified: Secondary | ICD-10-CM | POA: Insufficient documentation

## 2023-03-09 DIAGNOSIS — R9389 Abnormal findings on diagnostic imaging of other specified body structures: Secondary | ICD-10-CM | POA: Diagnosis present

## 2023-03-09 DIAGNOSIS — R918 Other nonspecific abnormal finding of lung field: Secondary | ICD-10-CM

## 2023-03-09 HISTORY — DX: Other nonspecific abnormal finding of lung field: R91.8

## 2023-03-27 ENCOUNTER — Other Ambulatory Visit: Payer: Self-pay | Admitting: Specialist

## 2023-03-27 DIAGNOSIS — R911 Solitary pulmonary nodule: Secondary | ICD-10-CM

## 2023-04-02 ENCOUNTER — Other Ambulatory Visit: Payer: Medicare Other

## 2023-04-03 ENCOUNTER — Ambulatory Visit
Admission: RE | Admit: 2023-04-03 | Discharge: 2023-04-03 | Disposition: A | Discharge status: Home / Self Care | Payer: Medicare Other | Source: Ambulatory Visit | Attending: Specialist | Admitting: Specialist

## 2023-04-03 DIAGNOSIS — R918 Other nonspecific abnormal finding of lung field: Secondary | ICD-10-CM | POA: Diagnosis not present

## 2023-04-03 DIAGNOSIS — J439 Emphysema, unspecified: Secondary | ICD-10-CM | POA: Diagnosis not present

## 2023-04-03 DIAGNOSIS — I700 Atherosclerosis of aorta: Secondary | ICD-10-CM | POA: Diagnosis not present

## 2023-04-03 DIAGNOSIS — R911 Solitary pulmonary nodule: Secondary | ICD-10-CM | POA: Diagnosis present

## 2023-04-03 LAB — GLUCOSE, CAPILLARY: Glucose-Capillary: 87 mg/dL (ref 70–99)

## 2023-04-03 MED ORDER — FLUDEOXYGLUCOSE F - 18 (FDG) INJECTION
7.1500 | Freq: Once | INTRAVENOUS | Status: AC | PRN
  Administered 2023-04-03: 7.15 via INTRAVENOUS

## 2023-04-04 DIAGNOSIS — R918 Other nonspecific abnormal finding of lung field: Secondary | ICD-10-CM

## 2023-04-04 HISTORY — DX: Other nonspecific abnormal finding of lung field: R91.8

## 2023-04-30 ENCOUNTER — Other Ambulatory Visit: Payer: Self-pay | Admitting: Pulmonary Disease

## 2023-05-09 ENCOUNTER — Other Ambulatory Visit: Payer: Self-pay | Admitting: Pulmonary Disease

## 2023-05-09 DIAGNOSIS — R918 Other nonspecific abnormal finding of lung field: Secondary | ICD-10-CM

## 2023-05-10 ENCOUNTER — Encounter
Admission: RE | Admit: 2023-05-10 | Discharge: 2023-05-10 | Disposition: A | Discharge status: Home / Self Care | Payer: Medicare Other | Source: Ambulatory Visit | Attending: Pulmonary Disease | Admitting: Pulmonary Disease

## 2023-05-10 ENCOUNTER — Other Ambulatory Visit: Payer: Self-pay

## 2023-05-10 DIAGNOSIS — R0609 Other forms of dyspnea: Secondary | ICD-10-CM

## 2023-05-10 DIAGNOSIS — I10 Essential (primary) hypertension: Secondary | ICD-10-CM

## 2023-05-10 DIAGNOSIS — J449 Chronic obstructive pulmonary disease, unspecified: Secondary | ICD-10-CM

## 2023-05-10 DIAGNOSIS — I5022 Chronic systolic (congestive) heart failure: Secondary | ICD-10-CM

## 2023-05-10 HISTORY — DX: Pneumonia, unspecified organism: J18.9

## 2023-05-10 HISTORY — DX: Essential (primary) hypertension: I10

## 2023-05-10 HISTORY — DX: Heart failure, unspecified: I50.9

## 2023-05-10 NOTE — Patient Instructions (Addendum)
 Your procedure is scheduled on: 05/18/23 - Friday Report to the Registration Desk on the 1st floor of the [facility]. To find out your arrival time, please call [phone] between 1PM - 3PM on: 05/17/23 - Thursday If your arrival time is 6:00 am, do not arrive before that time as the [facility] entrance doors do not open until 6:00 am.  REMEMBER: Instructions that are not followed completely may result in serious medical risk, up to and including death; or upon the discretion of your surgeon and anesthesiologist your surgery may need to be rescheduled.  Do not eat food or drink any liquids after midnight the night before surgery.  No gum chewing or hard candies.  One week prior to surgery: Stop Anti-inflammatories (NSAIDS) such as Advil, Aleve, Ibuprofen, Motrin, Naproxen, Naprosyn and Aspirin based products such as Excedrin, Goody's Powder, BC Powder.  Stop ANY OVER THE COUNTER supplements until after surgery.  You may take Tylenol  if needed for pain up until the day of surgery.   ON THE DAY OF SURGERY ONLY TAKE THESE MEDICATIONS WITH SIPS OF WATER:  albuterol  (PROVENTIL ) (2.5 MG/3ML) 0.083% nebulizer  2.   omeprazole (PRILOSEC)  3.   TRELEGY ELLIPTA  4.   cromolyn (NASALCROM)  Your procedure is scheduled on: Report to the Registration Desk on the 1st floor of the [facility]. To find out your arrival time, please call [phone] between 1PM - 3PM on: If your arrival time is 6:00 am, do not arrive before that time as the [facility] entrance doors do not open until 6:00 am.  REMEMBER: Instructions that are not followed completely may result in serious medical risk, up to and including death; or upon the discretion of your surgeon and anesthesiologist your surgery may need to be rescheduled.  Do not eat food after midnight the night before surgery.  No gum chewing or hard candies.  You may however, drink CLEAR liquids up to 2 hours before you are scheduled to arrive  for your surgery. Do not drink anything within 2 hours of your scheduled arrival time.  Clear liquids include: - water  - apple juice without pulp - gatorade (not RED colors) - black coffee or tea (Do NOT add milk or creamers to the coffee or tea) Do NOT drink anything that is not on this list.  **Type 1 and Type 2 diabetics should only drink water.**  In addition, your doctor has ordered for you to drink the provided:  Ensure Pre-Surgery Clear Carbohydrate Drink  Gatorade G2 Drinking this carbohydrate drink up to two hours before surgery helps to reduce insulin resistance and improve patient outcomes. Please complete drinking 2 hours before scheduled arrival time.  One week prior to surgery: Stop Anti-inflammatories (NSAIDS) such as Advil, Aleve, Ibuprofen, Motrin, Naproxen, Naprosyn and Aspirin based products such as Excedrin, Goody's Powder, BC Powder. Stop ANY OVER THE COUNTER supplements until after surgery.  You may however, continue to take Tylenol  if needed for pain up until the day of surgery.  **Follow guidelines for insulin and diabetes medications.**  **Follow recommendations regarding stopping blood thinners.**  Continue taking all of your other prescription medications up until the day of surgery.  ON THE DAY OF SURGERY ONLY TAKE THESE MEDICATIONS WITH SIPS OF WATER:    Use inhalers on the day of surgery and bring to the hospital.  Fleets enema or bowel prep as directed.  No Alcohol for 24 hours before or after surgery.  No Smoking including e-cigarettes  for 24 hours before surgery.  No chewable tobacco products for at least 6 hours before surgery.  No nicotine patches on the day of surgery.  Do not use any recreational drugs for at least a week (preferably 2 weeks) before your surgery.  Please be advised that the combination of cocaine and anesthesia may have negative outcomes, up to and including death. If you test positive for cocaine, your surgery will  be cancelled.  On the morning of surgery brush your teeth with toothpaste and water, you may rinse your mouth with mouthwash if you wish. Do not swallow any toothpaste or mouthwash.  Use CHG Soap or wipes as directed on instruction sheet.  Do not wear jewelry, make-up, hairpins, clips or nail polish.  For welded (permanent) jewelry: bracelets, anklets, waist bands, etc.  Please have this removed prior to surgery.  If it is not removed, there is a chance that hospital personnel will need to cut it off on the day of surgery.  Do not wear lotions, powders, or perfumes.   Do not shave body hair from the neck down 48 hours before surgery.  Contact lenses, hearing aids and dentures may not be worn into surgery.  Do not bring valuables to the hospital. [facility] is not responsible for any missing/lost belongings or valuables.   Total Shoulder Arthroplasty:  use Benzoyl Peroxide 5% Gel as directed on instruction sheet.  Bring your C-PAP to the hospital in case you may have to spend the night.   Notify your doctor if there is any change in your medical condition (cold, fever, infection).  Wear comfortable clothing (specific to your surgery type) to the hospital.  After surgery, you can help prevent lung complications by doing breathing exercises.  Take deep breaths and cough every 1-2 hours. Your doctor may order a device called an Incentive Spirometer to help you take deep breaths. When coughing or sneezing, hold a pillow firmly against your incision with both hands. This is called "splinting." Doing this helps protect your incision. It also decreases belly discomfort.  If you are being admitted to the hospital overnight, leave your suitcase in the car. After surgery it may be brought to your room.  In case of increased patient census, it may be necessary for you, the patient, to continue your postoperative care in the Same Day Surgery department.  If you are being discharged the day of  surgery, you will not be allowed to drive home. You will need a responsible individual to drive you home and stay with you for 24 hours after surgery.   If you are taking public transportation, you will need to have a responsible individual with you.  Please call the Pre-admissions Testing Dept. at [phone] if you have any questions about these instructions.  Surgery Visitation Policy:  Patients having surgery or a procedure may have two visitors.  Children under the age of 22 must have an adult with them who is not the patient.  Temporary Visitor Restrictions Due to increasing cases of flu, RSV and COVID-19: Children ages 66 and under will not be able to visit patients in [facility] hospitals under most circumstances.  Inpatient Visitation:    Visiting hours are 7 a.m. to 8 p.m. Up to four visitors are allowed at one time in a patient room. The visitors may rotate out with other people during the day.  One visitor age 9 or older may stay with the patient overnight and must be in the room  by 8 p.m.  Use inhalers on the day of surgery and bring to the hospital.  No Alcohol for 24 hours before or after surgery.  No Smoking including e-cigarettes for 24 hours before surgery.  No chewable tobacco products for at least 6 hours before surgery.  No nicotine patches on the day of surgery.  Do not use any recreational drugs for at least a week (preferably 2 weeks) before your surgery.  Please be advised that the combination of cocaine and anesthesia may have negative outcomes, up to and including death. If you test positive for cocaine, your surgery will be cancelled.  On the morning of surgery brush your teeth with toothpaste and water, you may rinse your mouth with mouthwash if you wish. Do not swallow any toothpaste or mouthwash.  Do not wear jewelry, make-up, hairpins, clips or nail polish.  For welded (permanent) jewelry: bracelets, anklets, waist bands, etc.  Please have  this removed prior to surgery.  If it is not removed, there is a chance that hospital personnel will need to cut it off on the day of surgery.  Take a fresh Shower/bath on the morning of surgery.  Do not wear lotions, powders, or perfumes.   Do not shave body hair from the neck down 48 hours before surgery.  Contact lenses, hearing aids and dentures may not be worn into surgery.  Do not bring valuables to the hospital. [facility] is not responsible for any missing/lost belongings or valuables.   Notify your doctor if there is any change in your medical condition (cold, fever, infection).  Wear comfortable clothing (specific to your surgery type) to the hospital.  After surgery, you can help prevent lung complications by doing breathing exercises.  Take deep breaths and cough every 1-2 hours. Your doctor may order a device called an Incentive Spirometer to help you take deep breaths. When coughing or sneezing, hold a pillow firmly against your incision with both hands. This is called "splinting." Doing this helps protect your incision. It also decreases belly discomfort.  If you are being admitted to the hospital overnight, leave your suitcase in the car. After surgery it may be brought to your room.  In case of increased patient census, it may be necessary for you, the patient, to continue your postoperative care in the Same Day Surgery department.  If you are being discharged the day of surgery, you will not be allowed to drive home. You will need a responsible individual to drive you home and stay with you for 24 hours after surgery.   If you are taking public transportation, you will need to have a responsible individual with you.  Please call the Pre-admissions Testing Dept. at [phone] if you have any questions about these instructions.  Surgery Visitation Policy:  Patients having surgery or a procedure may have two visitors.  Children under the age of 4 must have an  adult with them who is not the patient.  Temporary Visitor Restrictions Due to increasing cases of flu, RSV and COVID-19: Children ages 89 and under will not be able to visit patients in [facility] hospitals under most circumstances.  Inpatient Visitation:    Visiting hours are 7 a.m. to 8 p.m. Up to four visitors are allowed at one time in a patient room. The visitors may rotate out with other people during the day.  One visitor age 28 or older may stay with the patient overnight and must be in the room by 8  p.m.

## 2023-05-11 ENCOUNTER — Encounter
Admission: RE | Admit: 2023-05-11 | Discharge: 2023-05-11 | Disposition: A | Discharge status: Home / Self Care | Payer: Medicare Other | Source: Ambulatory Visit | Attending: Pulmonary Disease | Admitting: Pulmonary Disease

## 2023-05-11 DIAGNOSIS — J449 Chronic obstructive pulmonary disease, unspecified: Secondary | ICD-10-CM | POA: Diagnosis not present

## 2023-05-11 DIAGNOSIS — R0609 Other forms of dyspnea: Secondary | ICD-10-CM | POA: Diagnosis not present

## 2023-05-11 DIAGNOSIS — Z01810 Encounter for preprocedural cardiovascular examination: Secondary | ICD-10-CM | POA: Insufficient documentation

## 2023-05-11 DIAGNOSIS — I5022 Chronic systolic (congestive) heart failure: Secondary | ICD-10-CM | POA: Diagnosis not present

## 2023-05-11 DIAGNOSIS — I10 Essential (primary) hypertension: Secondary | ICD-10-CM

## 2023-05-15 ENCOUNTER — Ambulatory Visit
Admission: RE | Admit: 2023-05-15 | Discharge: 2023-05-15 | Disposition: A | Discharge status: Home / Self Care | Payer: Medicare Other | Source: Ambulatory Visit | Attending: Pulmonary Disease | Admitting: Pulmonary Disease

## 2023-05-15 DIAGNOSIS — R918 Other nonspecific abnormal finding of lung field: Secondary | ICD-10-CM | POA: Diagnosis present

## 2023-05-16 ENCOUNTER — Encounter: Payer: Self-pay | Admitting: Urgent Care

## 2023-05-16 NOTE — Progress Notes (Signed)
 Perioperative / Anesthesia Services  Pre-Admission Testing Clinical Review / Pre-Operative Anesthesia Consult  Date: 05/16/23  Patient Demographics:  Name: Bryan Montoya DOB: [birth year] MRN:   [id number]  Planned Surgical Procedure(s):    Case: [id number] Date/Time: 05/18/23 1200   Procedures:      ROBOTIC ASSISTED NAVIGATIONAL BRONCHOSCOPY     VIDEO BRONCHOSCOPY WITH ENDOBRONCHIAL ULTRASOUND   Anesthesia type: General   Pre-op diagnosis: Lung Mass R91.1   Location: [facility] PROCEDURE RM 02 / [facility] ORS FOR ANESTHESIA GROUP   Surgeons: Vida Rigger, MD      NOTE: Available PAT nursing documentation and vital signs have been reviewed. Clinical nursing staff has updated patient's PMH/PSHx, current medication list, and drug allergies/intolerances to ensure comprehensive history available to assist in medical decision making as it pertains to the aforementioned surgical procedure and anticipated anesthetic course. Extensive review of available clinical information personally performed. [place] PMH and PSHx updated with any diagnoses/procedures that  may have been inadvertently omitted during his intake with the pre-admission testing department's nursing staff.  Clinical Discussion:  Bryan Montoya is a 68 y.o. male who is submitted for pre-surgical anesthesia review and clearance prior to him undergoing the above procedure. Patient is a Former Smoker (30 pack years; quit 01/2006). 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  Patient is followed by cardiology Juliann Pares, MD). He was last seen in the cardiology clinic on 04/30/2023; notes reviewed. At the time of his clinic visit, patient doing well overall from a cardiovascular perspective.  Patient with some mild shortness of breath and resulting hypoxemia  related to his underlying COPD diagnosis.  Patient is a former smoker.  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  Patient utilizes supplemental oxygen on a as needed basis.  Patient denied any chest pain, PND, orthopnea, palpitations, significant peripheral edema, weakness, fatigue, vertiginous symptoms, or presyncope/syncope. Patient with a past medical history significant for cardiovascular diagnoses. Documented physical exam was grossly benign, providing no evidence of acute exacerbation and/or decompensation of the patient's known cardiovascular conditions.  TTE performed on 01/03/2016 revealed a mildly reduced left ventricular systolic function with an EF of 45%.  There was global hypokinesis.  Mild LVH was observed.  There was mild pan valvular regurgitation.Marland Kitchen  RVSP normal at 21.3 mmHg.  All transvalvular gradients were noted to be normal providing no evidence suggestive of valvular stenosis.  Aorta normal in size with no evidence of ectasia or aortic dilatation.  Blood pressure elevated at 156/78 mmHg; patient not on oral antihypertensives.  Patient not taking any type of lipid-lowering therapies.  He is not diabetic.  Patient does not have an OSAH diagnosis, however he does require supplemental oxygen at bedtime for nocturnal hypoxemia.  Functional capacity somewhat limited by patient's overall respiratory status.  With that said, patient is able to complete all of his ADLs/IADLs without significant cardiovascular limitation.  Per the [facility], patient is able to exceed 4 METS of physical activity without experiencing any significant degrees of angina/anginal equivalent symptoms.  Given that patient is having increased shortness of breath as of late, the decision was made to repeat his functional study.  No changes were made to his medication regimen.  Patient to follow-up with outpatient cardiology in 3 months or sooner  if needed.  Since patient was last seen by  his cardiologist, he has undergone the recommended noninvasive functional cardiovascular study.  Most recent TTE was performed on 03/12/2024 revealing a moderately reduced left ventricular systolic function with an EF of 35-40%.  There were no regional wall motion abnormalities. Left ventricular diastolic Doppler parameters consistent with abnormal relaxation (G1DD).  Right ventricular size and function normal.  There was trivial mitral and tricuspid, in addition to mild aortic valve regurgitation.  Again, patient with normal transvalvular gradients indicating no evidence of valvular stenosis.  Aorta normal with no evidence of ectasia or aneurysmal dilatation.  Bryan Montoya underwent CT imaging of the chest on 03/09/2023 revealing a 6.3 cm mass like patchy opacity in the posterior aspect of the LEFT upper pulmonary lobe.  Additionally, there were multiple (stable) pulmonary nodules noted contralaterally and the RIGHT lung fields.  Subsequent PET CT imaging was performed on 04/03/2023 revealing a large partially necrotic appearing LEFT upper lobe pulmonary.  Mass was hypermetabolic on PET with a maximum SUV of 14.26 consistent with a primary bronchogenic carcinoma.  Patient was referred to pulmonary medicine for evaluation and discussions regarding tissue biopsy for definitive diagnosis.  Patient has been scheduled for ROBOTIC ASSISTED NAVIGATIONAL BRONCHOSCOPY; VIDEO BRONCHOSCOPY WITH ENDOBRONCHIAL ULTRASOUND on 05/18/2023 with Dr. Vida Rigger, MD. Given patient's past medical history significant for cardiovascular diagnoses, presurgical cardiac clearance was sought by the PAT team. Per cardiology, "this patient is optimized for surgery and may proceed with the planned procedural course with a LOW risk of significant perioperative cardiovascular complications".  In review of his medication reconciliation, the patient is not noted to be taking any type of  anticoagulation or antiplatelet therapies that would need to be held during his perioperative course.  Patient denies previous perioperative complications with anesthesia in the past. In review his EMR, it is noted that patient underwent a MAC anesthetic course here at [facility] ([facility]) in 07/2022 without documented complications.      08/23/2022   10:50 AM 07/18/2022    2:20 PM 07/17/2022   10:19 AM  Vitals with BMI  Height 5\' 8"  5\' 8"    Weight 134 lbs 6 oz 135 lbs   BMI 20.44 20.53   Systolic 116 132 509  Diastolic 66 69 62  Pulse 56 61 53   Providers/Specialists:  NOTE: Primary physician provider listed below. Patient may have been seen by APP or partner within same practice.   PROVIDER ROLE / SPECIALTY LAST Yolanda Manges, MD Pulmonary Medicine (Surgeon) Will see for procedure only on 05/18/2023  Gracelyn Nurse, MD Primary Care Provider 05/02/2023  Rudean Hitt, MD Cardiology 04/30/2023  Ned Clines, MD Pulmonary Medicine 04/23/2023   Allergies:   Allergies  Allergen Reactions   Hydrocodone Nausea And Vomiting   Hydrocodone Bit-[place] Mbr Nausea And Vomiting   Current Home Medications:   No current facility-administered medications for this encounter.    albuterol (PROVENTIL HFA;VENTOLIN HFA) 108 (90 Base) MCG/ACT inhaler   albuterol (PROVENTIL) (2.5 MG/3ML) 0.083% nebulizer solution   cromolyn (NASALCROM) 5.2 MG/ACT nasal spray   finasteride (PROSCAR) 5 MG tablet   fluticasone (FLONASE) 50 MCG/ACT nasal spray   guaiFENesin (MUCINEX) 600 MG 12 hr tablet   omeprazole (PRILOSEC) 20 MG capsule   OXYGEN   traMADol (ULTRAM) 50 MG tablet   TRELEGY ELLIPTA 100-62.5-25 MCG/ACT AEPB   History:   Past Medical History:  Diagnosis Date   Aortic atherosclerosis (HCC)    Arthritis    Benign prostatic hyperplasia with  urinary obstruction    Bradycardia    COPD (chronic obstructive pulmonary disease) (HCC)    Dyspnea     Emphysema lung (HCC)    GERD (gastroesophageal reflux disease)    HFrEF (heart failure with reduced ejection fraction) (HCC)    a.) TTE 01/03/2016: EF 45%, glob hk, mild LVH, norm RVSF, mild AR/MR/TR; b.) TTE 05/14/2023: EF 35-40%, no RWMAs, G1DD, norm RVSF, triv MR/TR, mild AR   HLD (hyperlipidemia)    Hypertension    Mass of upper lobe of left lung 03/09/2023   a.) CT chest 03/09/2023: 6.3 cm mass-like patchy opacity posterior LUL; b.) PET CT 04/03/2023: large partial necrotic appearing LUL mass with SUV max 14.26 --> consistent with primary bronchogenic carcinoma   Multilevel degenerative disc disease    a.) s/p partial laminectomy with L4-S1 decompression and far lateral foraminotomy excision of L5-S1 disc herniation (05/2012); b.) s/p bilateral rexploration lumbar decompression and fusion L4-S1 (08/04/2016)   Multiple lung nodules on CT    Nephrolithiasis    Oxygen dependent    2 Liters resting , 2.5 Liters bedtime , 3 Liters with exertion.   Pneumonia due to COVID-19 virus    Past Surgical History:  Procedure Laterality Date   BILATERAL REEXPLORATION LUMBAR DECOMPRESSION STABILIZATION SPINAL FUSION L4-5, L5-S1 Bilateral 08/04/2016   CYSTOSCOPY WITH INSERTION OF UROLIFT N/A 07/17/2022   Procedure: CYSTOSCOPY WITH INSERTION OF UROLIFT;  Surgeon: [name], MD;  Location: [facility] ORS;  Service: Urology;  Laterality: N/A;   ENDOSCOPIC RELEASE TRANSVERSE CARPAL LIGAMENT OF HAND Right 2014   FOOT SURGERY Right 1991   KNEE ARTHROSCOPY W/ MENISCECTOMY Right 06/05/2005   PARTIAL LAMINECTOMY WITH L4-S1 DECOMPRESSION AND FAR LATERAL FORAMINOTOMY EXCISION OF L5-S1 DISC HERNIATION N/A 05/2012   No family history on file. Social History   Tobacco Use   Smoking status: Former    Current packs/day: 0.00    Average packs/day: 1 pack/day for 30.0 years (30.0 ttl pk-yrs)    Types: Cigarettes    Start date: 01/02/1976    Quit date: 01/01/2006    Years since quitting: 17.3   Smokeless tobacco:  Never   Tobacco comments:    patient does not smoke  Substance Use Topics   Alcohol use: Never   Pertinent Clinical Results:  LABS:  Component Ref Range & Units 04/02/2023  Glucose 70 - 110 mg/dL 73  Sodium 841 - 324 mmol/L 139  Potassium 3.6 - 5.1 mmol/L 4.6  Chloride 97 - 109 mmol/L 103  Carbon Dioxide (CO2) 22.0 - 32.0 mmol/L 31.1  Calcium 8.7 - 10.3 mg/dL 9.1  Urea Nitrogen (BUN) 7 - 25 mg/dL 19  Creatinine 0.7 - 1.3 mg/dL 0.8  Glomerular Filtration Rate (eGFR) >60 mL/min/1.73sq m 96  BUN/Crea Ratio 6.0 - 20.0 23.8 High   Anion Gap w/K 6.0 - 16.0 9.5  Resulting Agency [facility]  Specimen Collected: 04/02/23 07:37   Performed by: [facility] Last Resulted: 04/02/23 09:56  Received From: [facility]  Result Received: 04/03/23 09:34   Component Ref Range & Units 04/02/2023  WBC (White Blood Cell Count) 4.1 - 10.2 10^3/uL 6.8  RBC (Red Blood Cell Count) 4.69 - 6.13 10^6/uL 4.55 Low   Hemoglobin 14.1 - 18.1 gm/dL 40.1 Low   Hematocrit 02.7 - 52.0 % 41.8  MCV (Mean Corpuscular Volume) 80.0 - 100.0 fl 91.9  MCH (Mean Corpuscular Hemoglobin) 27.0 - 31.2 pg 30.1  MCHC (Mean Corpuscular Hemoglobin Concentration) 32.0 - 36.0  gm/dL 09.8  Platelet Count 119 - 450 10^3/uL 260  RDW-CV (Red Cell Distribution Width) 11.6 - 14.8 % 14.3  MPV (Mean Platelet Volume) 9.4 - 12.4 fl 9.2 Low   Neutrophils 1.50 - 7.80 10^3/uL 4.47  Lymphocytes 1.00 - 3.60 10^3/uL 1.44  Monocytes 0.00 - 1.50 10^3/uL 0.68  Eosinophils 0.00 - 0.55 10^3/uL 0.11  Basophils 0.00 - 0.09 10^3/uL 0.04  Neutrophil % 32.0 - 70.0 % 66.1  Lymphocyte % 10.0 - 50.0 % 21.3  Monocyte % 4.0 - 13.0 % 10.1  Eosinophil % 1.0 - 5.0 % 1.6  Basophil% 0.0 - 2.0 % 0.6  Immature Granulocyte % <=0.7 % 0.3  Immature Granulocyte Count <=0.06 10^3/L 0.02  Resulting Agency [facility]  Specimen Collected: 04/02/23 07:37   Performed by:  [facility] Last Resulted: 04/02/23 09:59  Received From: [facility]  Result Received: 04/03/23 09:34     ECG: Date: 05/11/2023  Time ECG obtained: 0809 AM Rate: 51 bpm Rhythm: sinus bradycardia Axis (leads I and aVF): normal Intervals: PR 179 ms. QRS 82 ms. QTc 401 ms. ST segment and T wave changes: No evidence of acute T wave abnormalities or significant ST segment elevation or depression.  Evidence of a possible, age undetermined, prior infarct:  No Comparison: Similar to previous tracing obtained on 07/13/2022   IMAGING / PROCEDURES: TRANSTHORACIC ECHOCARDIOGRAM performed on 05/14/2023 Moderate left ventricular systolic dysfunction with an EF of 40%. Left ventricular diastolic Doppler parameters consistent with abnormal relaxation (G1DD). Normal right ventricular systolic function Trivial MR and TR Mild AR Normal gradients; no valvular stenosis  NM PET IMAGE INITIAL (PI) SKULL BASE TO THIGH (F-18 FDG) performed on 04/03/2023 Large partially necrotic appearing left upper lobe lung mass is hypermetabolic consistent with primary lung neoplasm. No findings for mediastinal, hilar or axillary adenopathy. No findings for metastatic disease involving the abdomen/pelvis or bony structures. Aortic atherosclerosis  Emphysema  CT CHEST WO CONTRAST performed on 03/09/2023 6.3 cm mass like patchy opacity in the posterior left upper lobe, mildly improved. However, given persistence, primary bronchogenic carcinoma is not excluded. PET-CT is suggested for further evaluation. Near complete resolution of the prior posterior left upper lobe and right lower lobe pneumonia, with mild residual post infectious inflammatory scarring. Stable right lung nodules measuring up to 8 mm, as above. Continued attention on follow-up is suggested. Aortic atherosclerosis  Emphysema  PULMONARY FUNCTION TESTING performed on 10/24/2022 FVC was 4.41 liters, 125% of  predicted FEV1 was 1.63, 59% of predicted FEV1 ratio was 36.57 FEF 25-75% liters per second was 15% of predicted TLC was 159% of predicted RV was 260% of predicted DLCO was 40% of predicted DLCO/VA was 42% of predicted Interpretation Spirometry is consistent with severe obstruction Lung volume volumes consistent with hyperinflation DLCO is severely decreased   Impression and Plan:  Bryan Montoya has been referred for pre-anesthesia review and clearance prior to him undergoing the planned anesthetic and procedural courses. Available labs, pertinent testing, and imaging results were personally reviewed by me in preparation for upcoming operative/procedural course. [facility] medical record has been updated following extensive record review and patient interview with PAT staff.   This patient has been appropriately cleared by cardiology with an overall LOW risk of experiencing significant perioperative cardiovascular complications. Based on clinical review performed today (05/16/23), barring any significant acute changes in the patient's overall condition, it is anticipated that he will be able to proceed with the planned surgical intervention.  Any acute changes in clinical condition may necessitate his procedure being postponed and/or cancelled. Patient will meet with anesthesia team (MD and/or CRNA) on the day of his procedure for preoperative evaluation/assessment. Questions regarding anesthetic course will be fielded at that time.   Pre-surgical instructions were reviewed with the patient during his PAT appointment, and questions were fielded to satisfaction by PAT clinical staff. He has been instructed on which medications that he will need to hold prior to surgery, as well as the ones that have been deemed safe/appropriate to take on the day of his procedure. As part of the general education provided by PAT, patient made aware both verbally and in writing, that he would need to abstain from the  use of any illegal substances during his perioperative course. He was advised that failure to follow the provided instructions could necessitate case cancellation or result in serious perioperative complications up to and including death. Patient encouraged to contact PAT and/or his surgeon's office to discuss any questions or concerns that may arise prior to surgery; verbalized understanding.   Quentin Mulling, MSN, APRN, FNP-C, CEN [facility]  Perioperative Services Nurse Practitioner Phone: [phone] Fax: [phone] 05/16/23 11:23 AM  NOTE: This note has been prepared using Dragon dictation software. Despite my best ability to proofread, there is always the potential that unintentional transcriptional errors may still occur from this process.

## 2023-05-18 ENCOUNTER — Encounter
Admission: RE | Disposition: A | Discharge status: Home / Self Care | Payer: Self-pay | Source: Home / Self Care | Attending: Pulmonary Disease

## 2023-05-18 ENCOUNTER — Ambulatory Visit: Payer: Medicare Other | Admitting: Urgent Care

## 2023-05-18 ENCOUNTER — Other Ambulatory Visit: Payer: Self-pay

## 2023-05-18 ENCOUNTER — Ambulatory Visit: Payer: Medicare Other

## 2023-05-18 ENCOUNTER — Ambulatory Visit
Admission: RE | Admit: 2023-05-18 | Discharge: 2023-05-18 | Disposition: A | Discharge status: Home / Self Care | Payer: Medicare Other | Attending: Pulmonary Disease | Admitting: Pulmonary Disease

## 2023-05-18 ENCOUNTER — Ambulatory Visit: Payer: Self-pay | Admitting: Urgent Care

## 2023-05-18 DIAGNOSIS — J984 Other disorders of lung: Secondary | ICD-10-CM | POA: Insufficient documentation

## 2023-05-18 DIAGNOSIS — T17890A Other foreign object in other parts of respiratory tract causing asphyxiation, initial encounter: Secondary | ICD-10-CM | POA: Diagnosis not present

## 2023-05-18 DIAGNOSIS — X58XXXA Exposure to other specified factors, initial encounter: Secondary | ICD-10-CM | POA: Insufficient documentation

## 2023-05-18 DIAGNOSIS — I110 Hypertensive heart disease with heart failure: Secondary | ICD-10-CM | POA: Diagnosis not present

## 2023-05-18 DIAGNOSIS — Z87891 Personal history of nicotine dependence: Secondary | ICD-10-CM | POA: Diagnosis not present

## 2023-05-18 DIAGNOSIS — R918 Other nonspecific abnormal finding of lung field: Secondary | ICD-10-CM | POA: Diagnosis present

## 2023-05-18 DIAGNOSIS — K219 Gastro-esophageal reflux disease without esophagitis: Secondary | ICD-10-CM | POA: Insufficient documentation

## 2023-05-18 DIAGNOSIS — M1990 Unspecified osteoarthritis, unspecified site: Secondary | ICD-10-CM | POA: Diagnosis not present

## 2023-05-18 DIAGNOSIS — R0602 Shortness of breath: Secondary | ICD-10-CM | POA: Insufficient documentation

## 2023-05-18 DIAGNOSIS — I5022 Chronic systolic (congestive) heart failure: Secondary | ICD-10-CM | POA: Diagnosis not present

## 2023-05-18 DIAGNOSIS — J432 Centrilobular emphysema: Secondary | ICD-10-CM | POA: Diagnosis not present

## 2023-05-18 DIAGNOSIS — Z9981 Dependence on supplemental oxygen: Secondary | ICD-10-CM | POA: Insufficient documentation

## 2023-05-18 HISTORY — DX: Unspecified systolic (congestive) heart failure: I50.20

## 2023-05-18 HISTORY — DX: Calculus of kidney: N20.0

## 2023-05-18 HISTORY — DX: Hyperlipidemia, unspecified: E78.5

## 2023-05-18 HISTORY — DX: Dorsopathy, unspecified: M53.9

## 2023-05-18 HISTORY — DX: COVID-19: U07.1

## 2023-05-18 HISTORY — DX: Bradycardia, unspecified: R00.1

## 2023-05-18 HISTORY — PX: VIDEO BRONCHOSCOPY WITH ENDOBRONCHIAL ULTRASOUND: SHX6177

## 2023-05-18 HISTORY — DX: Other nonspecific abnormal finding of lung field: R91.8

## 2023-05-18 SURGERY — BRONCHOSCOPY, WITH BIOPSY USING ELECTROMAGNETIC NAVIGATION
Anesthesia: General

## 2023-05-18 MED ORDER — CHLORHEXIDINE GLUCONATE 0.12 % MT SOLN
OROMUCOSAL | Status: AC
Start: 2023-05-18 — End: ?
  Filled 2023-05-18: qty 15

## 2023-05-18 MED ORDER — PROPOFOL 10 MG/ML IV BOLUS
INTRAVENOUS | Status: DC | PRN
  Administered 2023-05-18: 90 mg via INTRAVENOUS

## 2023-05-18 MED ORDER — DEXAMETHASONE SODIUM PHOSPHATE 10 MG/ML IJ SOLN
INTRAMUSCULAR | Status: DC | PRN
  Administered 2023-05-18: 10 mg via INTRAVENOUS

## 2023-05-18 MED ORDER — ROCURONIUM BROMIDE 10 MG/ML (PF) SYRINGE
PREFILLED_SYRINGE | INTRAVENOUS | Status: AC
  Filled 2023-05-18: qty 10

## 2023-05-18 MED ORDER — ONDANSETRON HCL 4 MG/2ML IJ SOLN
INTRAMUSCULAR | Status: DC | PRN
  Administered 2023-05-18: 4 mg via INTRAVENOUS

## 2023-05-18 MED ORDER — ONDANSETRON HCL 4 MG/2ML IJ SOLN
INTRAMUSCULAR | Status: AC
  Filled 2023-05-18: qty 2

## 2023-05-18 MED ORDER — FENTANYL CITRATE (PF) 100 MCG/2ML IJ SOLN
INTRAMUSCULAR | Status: AC
  Filled 2023-05-18: qty 2

## 2023-05-18 MED ORDER — LIDOCAINE HCL (PF) 2 % IJ SOLN
INTRAMUSCULAR | Status: AC
  Filled 2023-05-18: qty 5

## 2023-05-18 MED ORDER — SUGAMMADEX SODIUM 200 MG/2ML IV SOLN
INTRAVENOUS | Status: DC | PRN
  Administered 2023-05-18: 160 mg via INTRAVENOUS

## 2023-05-18 MED ORDER — DEXAMETHASONE SODIUM PHOSPHATE 10 MG/ML IJ SOLN
INTRAMUSCULAR | Status: AC
  Filled 2023-05-18: qty 1

## 2023-05-18 MED ORDER — CHLORHEXIDINE GLUCONATE 0.12 % MT SOLN
15.0000 mL | Freq: Once | OROMUCOSAL | Status: AC
  Administered 2023-05-18: 15 mL via OROMUCOSAL

## 2023-05-18 MED ORDER — LACTATED RINGERS IV SOLN: INTRAVENOUS | Status: DC

## 2023-05-18 MED ORDER — ORAL CARE MOUTH RINSE: 15.0000 mL | Freq: Once | OROMUCOSAL | Status: AC

## 2023-05-18 MED ORDER — DROPERIDOL 2.5 MG/ML IJ SOLN: 0.6250 mg | Freq: Once | INTRAMUSCULAR | Status: DC | PRN

## 2023-05-18 MED ORDER — LIDOCAINE HCL (CARDIAC) PF 100 MG/5ML IV SOSY
PREFILLED_SYRINGE | INTRAVENOUS | Status: DC | PRN
Start: 2023-05-18 — End: 2023-05-18
  Administered 2023-05-18: 100 mg via INTRAVENOUS

## 2023-05-18 MED ORDER — PROPOFOL 10 MG/ML IV BOLUS
INTRAVENOUS | Status: AC
  Filled 2023-05-18: qty 20

## 2023-05-18 MED ORDER — ROCURONIUM BROMIDE 100 MG/10ML IV SOLN
INTRAVENOUS | Status: DC | PRN
  Administered 2023-05-18: 70 mg via INTRAVENOUS

## 2023-05-18 MED ORDER — FENTANYL CITRATE (PF) 100 MCG/2ML IJ SOLN
INTRAMUSCULAR | Status: DC | PRN
  Administered 2023-05-18: 25 ug via INTRAVENOUS
  Administered 2023-05-18: 50 ug via INTRAVENOUS
  Administered 2023-05-18: 25 ug via INTRAVENOUS

## 2023-05-18 MED ORDER — FENTANYL CITRATE (PF) 100 MCG/2ML IJ SOLN: 25.0000 ug | INTRAMUSCULAR | Status: DC | PRN

## 2023-05-18 NOTE — Procedures (Signed)
ROBOTIC NAVIGATIONAL BRONCHOSCOPY PROCEDURE NOTE  FLEXIBLE BRONCHOSCOPY WITH THERAPEUTIC ASPIRATION OF TRACHEOBRONCHIAL TREE  FIBEROPTIC BRONCHOSCOPY WITH BRONCHOALVEOLAR LAVAGE PROCEDURE NOTE  ENDOBRONCHIAL ULTRASOUND PROCEDURE NOTE >/= 1LYMPH NODE BIOPSIED    Flexible bronchoscopy was performed  by : Karna Christmas MD  assistance by : 1)Repiratory therapist  and 2)[employer] staff and 3) Anesthesia team and 4) Flouroscopy team and 5) [employer] supporting staff   Indication for the procedure was :  Pre-procedural H&P. The following assessment was performed on the day of the procedure prior to initiating sedation History:  Chest pain n Dyspnea y Hemoptysis n Cough y Fever n Other pertinent items n  Examination Vital signs -reviewed as per nursing documentation today Cardiac    Murmurs: n  Rubs : n  Gallop: n Lungs Wheezing: n Rales : n Rhonchi :y  Other pertinent findings: SOB/hypoxemia due to chronic lung disease   Pre-procedural assessment for Procedural Sedation included: Depth of sedation: As per anesthesia team  ASA Classification:  2 Mallampati airway assessment: 3    Medication list reviewed: y  The patient's interval history was taken and revealed: no new complaints The pre- procedure physical examination revealed: No new findings Refer to prior clinic note for details.  Informed Consent: Informed consent was obtained from:  patient after explanation of procedure and risks, benefits, as well as alternative procedures available.  Explanation of level of sedation and possible transfusion was also provided.    Procedural Preparation: Time out was performed and patient was identified by name and birthdate and procedure to be performed and side for sampling, if any, was specified. Pt was intubated by anesthesia.  The patient was appropriately draped.   Fiberoptic bronchoscopy with airway inspection and BAL Procedure findings:  Bronchoscope was  inserted via ETT  without difficulty.  Posterior oropharynx, epiglottis, arytenoids, false cords and vocal cords were not visualized as these were bypassed by endotracheal tube. The distal trachea was normal in circumference and appearance without mucosal, cartilaginous or branching abnormalities.  The main carina was mildly splayed . All right and left lobar airways were visualized to the Subsegmental level.  Sub- sub segmental carinae were identified in all the distal airways.   Secretions were visible in the following airways and appeared to be clear.  The mucosa was : friable at LUL  Airways were notable for:        exophytic lesions :n       extrinsic compression in the following distributions: n.       Friable mucosa: y       Teacher, music /pigmentation: Y   MUCUS PLUGGING WAS EVACUATED FROM LUL WITH THERAPEUTIC ASPIRATION OF TRACHEOBRONCHIAL TREE   Post procedure Diagnosis:   MUCUS PLUGGING OF TRACHEOBRONCHIAL TREE WORSE AT LEFT UPPER LOBE      ROBOTIC Navigational Bronchoscopy Procedure Findings:    Post appropriate planning and registration peripheral robotic navigation was used to visualize target lesion.    Post procedure diagnosis: - ATYPICAL CELLS SUSPICIOUS FOR LUNG CANCER PER CYTOTECH  Target #1- LUL - cytobrush x 2 -  atypical cells  Target #1 - LUL - SURGICAL PATHOLOGY - ENDOBRONCHIAL BIOPSY X 9 - ATYPICAL CELLS SUSPICIOUS FOR CARCINOMA  Target #1 - LUL- BAL -sent for cytology     Endobronchial ultrasound assisted hilar and mediastinal lymph node biopsies procedure findings: The fiberoptic bronchoscope was removed and the EBUS scope was introduced. Examination began to evaluate for pathologically enlarged lymph nodes starting on the right side progressing to the left  side.  All lymph node biopsies performed with 21g needle. Lymph node biopsies were sent in cytolite for all stations.  Station 10R - 6mm not biopsied Station 4R - 5mm - not biopsied Station 7 -  1.2cm - biopsied 3 times Station 10L - 6mm - not biopsied Station 4L - 6mm - not biopsied   Post procedure diagnosis:  Mild lymphadenopathy at station 7 sent for cytology    Specimens obtained included:                 Cytology brushes : LUL  Broncho-alveolar lavage site:LUL  sent for cytology                              40 ml volume infused 20 ml volume returned with bloody appearance  Endobronchial biopsy site:  LUL ; sent for cytology                                   Fluoroscopy Used: yes ;        Pictorial documentation attached: none         Immediate sampling complications included:none  Epinephrine zero ml was used topically  The bronchoscopy was terminated due to completion of the planned procedure and the bronchoscope was removed.   Total dosage of Lidocaine was zero mg Total fluoroscopy time was as per radiology  minutes  Supplemental oxygen was provided at as per anesthesia lpm by nasal canula post operatively  Estimated Blood loss: expected <10 cc.  Complications included:  None immediate   Preliminary CXR findings :  In process  Disposition: home with family   Follow up with Dr. Karna Christmas in 5 days for result discussion.     Vida Rigger MD  [facility] Division of Pulmonary & Critical Care Medicine

## 2023-05-18 NOTE — Transfer of Care (Signed)
Immediate Anesthesia Transfer of Care Note  Patient: Gray Bernhardt  Procedure(s) Performed: ROBOTIC ASSISTED NAVIGATIONAL BRONCHOSCOPY VIDEO BRONCHOSCOPY WITH ENDOBRONCHIAL ULTRASOUND  Patient Location: PACU  Anesthesia Type:General  Level of Consciousness: awake, alert , and drowsy  Airway & Oxygen Therapy: Patient Spontanous Breathing and Patient connected to face mask oxygen  Post-op Assessment: Report given to RN and Post -op Vital signs reviewed and stable  Post vital signs: Reviewed and stable  Last Vitals:  Vitals Value Taken Time  BP 167/94 05/18/23 1358  Temp 35.8 1358  Pulse 58 05/18/23 1400  Resp 15 05/18/23 1400  SpO2 99 1359  Vitals shown include unfiled device data.  Last Pain:  Vitals:   05/18/23 1118  TempSrc: Oral  PainSc: 0-No pain         Complications: No notable events documented.

## 2023-05-18 NOTE — H&P (Signed)
PULMONOLOGY         Date: 05/18/2023,   MRN# [id number] Bryan Montoya 14-Jul-1955     AdmissionWeight: 65.8 kg                 CurrentWeight: 65.8 kg  Referring provider: Dr Meredeth Ide    CHIEF COMPLAINT:   Left upper lobe mass with hilar adenopathy   HISTORY OF PRESENT ILLNESS    This is a pleasant 68 yo M with hx of centrilobular emphysema with advanced COPD and chronic dyspnea. He was seen with [facility] pulmonary and treated for COPD.  He had CT and PET scan performed with findings of necrotic appearing left upper lobe lung mass is hypermetabolic with SUV max of 14.26. Findings consistent with primary lung neoplasm. He has this lesion right at the fissure and we discussed the implications of this location around fissure and sorrounding emphysema predispose biopsy to pnemothorax.  He wishes to have full scope of therapy and has wife and granddaughter with him whom he discussed care plan with. He is agreeable to any hospitalization of chest tube placement that may be needed post procedure.  We discussed additional risks and benefits and answered questions. Reviewed risks/complications and benefits with patient, risks include infection, pneumothorax/pneumomediastinum which may require chest tube placement as well as overnight/prolonged hospitalization and possible mechanical ventilation. Other risks include bleeding and very rarely death.  Patient understands risks and wishes to proceed.  Additional questions were answered, and patient is aware that post procedure patient will be going home with family and may experience cough with possible clots on expectoration as well as phlegm which may last few days as well as hoarseness of voice post intubation and mechanical ventilation.    PAST MEDICAL HISTORY   Past Medical History:  Diagnosis Date   Aortic atherosclerosis (HCC)    Arthritis    Benign prostatic hyperplasia with urinary obstruction    Bradycardia    COPD (chronic  obstructive pulmonary disease) (HCC)    Dyspnea    Emphysema lung (HCC)    GERD (gastroesophageal reflux disease)    HFrEF (heart failure with reduced ejection fraction) (HCC)    a.) TTE 01/03/2016: EF 45%, glob hk, mild LVH, norm RVSF, mild AR/MR/TR; b.) TTE 05/14/2023: EF 35-40%, no RWMAs, G1DD, norm RVSF, triv MR/TR, mild AR   HLD (hyperlipidemia)    Hypertension    Mass of upper lobe of left lung 03/09/2023   a.) CT chest 03/09/2023: 6.3 cm mass-like patchy opacity posterior LUL; b.) PET CT 04/03/2023: large partial necrotic appearing LUL mass with SUV max 14.26 --> consistent with primary bronchogenic carcinoma   Multilevel degenerative disc disease    a.) s/p partial laminectomy with L4-S1 decompression and far lateral foraminotomy excision of L5-S1 disc herniation (05/2012); b.) s/p bilateral rexploration lumbar decompression and fusion L4-S1 (08/04/2016)   Multiple lung nodules on CT    Nephrolithiasis    Oxygen dependent    2 Liters resting , 2.5 Liters bedtime , 3 Liters with exertion.   Pneumonia due to COVID-19 virus      SURGICAL HISTORY   Past Surgical History:  Procedure Laterality Date   BILATERAL REEXPLORATION LUMBAR DECOMPRESSION STABILIZATION SPINAL FUSION L4-5, L5-S1 Bilateral 08/04/2016   CYSTOSCOPY WITH INSERTION OF UROLIFT N/A 07/17/2022   Procedure: CYSTOSCOPY WITH INSERTION OF UROLIFT;  Surgeon: [name], MD;  Location: [facility] ORS;  Service: Urology;  Laterality: N/A;   ENDOSCOPIC RELEASE TRANSVERSE CARPAL LIGAMENT OF HAND Right 2014  FOOT SURGERY Right 1991   KNEE ARTHROSCOPY W/ MENISCECTOMY Right 06/05/2005   PARTIAL LAMINECTOMY WITH L4-S1 DECOMPRESSION AND FAR LATERAL FORAMINOTOMY EXCISION OF L5-S1 DISC HERNIATION N/A 05/2012     FAMILY HISTORY   History reviewed. No pertinent family history.   SOCIAL HISTORY   Social History   Tobacco Use   Smoking status: Former    Current packs/day: 0.00    Average packs/day: 1 pack/day for 30.0 years  (30.0 ttl pk-yrs)    Types: Cigarettes    Start date: 01/02/1976    Quit date: 01/01/2006    Years since quitting: 17.3   Smokeless tobacco: Never   Tobacco comments:    patient does not smoke  Vaping Use   Vaping status: Never Used  Substance Use Topics   Alcohol use: Never   Drug use: Never     MEDICATIONS    Home Medication:    Current Medication:  Current Facility-Administered Medications:    lactated ringers infusion, , Intravenous, Continuous, Adams, Currie Paris, MD    ALLERGIES   Hydrocodone and Hydrocodone bit-homatrop mbr     REVIEW OF SYSTEMS    Review of Systems:  Gen:  Denies  fever, sweats, chills weigh loss  HEENT: Denies blurred vision, double vision, ear pain, eye pain, hearing loss, nose bleeds, sore throat Cardiac:  No dizziness, chest pain or heaviness, chest tightness,edema Resp:   reports dyspnea chronically  Gi: Denies swallowing difficulty, stomach pain, nausea or vomiting, diarrhea, constipation, bowel incontinence Gu:  Denies bladder incontinence, burning urine Ext:   Denies Joint pain, stiffness or swelling Skin: Denies  skin rash, easy bruising or bleeding or hives Endoc:  Denies polyuria, polydipsia , polyphagia or weight change Psych:   Denies depression, insomnia or hallucinations   Other:  All other systems negative   VS: BP (!) 149/93   Pulse (!) 54   Temp 98.5 F (36.9 C) (Oral)   Resp 18   Ht 5\' 8"  (1.727 m)   Wt 65.8 kg   SpO2 99%   BMI 22.05 kg/m      PHYSICAL EXAM    GENERAL:NAD, no fevers, chills, no weakness no fatigue HEAD: Normocephalic, atraumatic.  EYES: Pupils equal, round, reactive to light. Extraocular muscles intact. No scleral icterus.  MOUTH: Moist mucosal membrane. Dentition intact. No abscess noted.  EAR, NOSE, THROAT: Clear without exudates. No external lesions.  NECK: Supple. No thyromegaly. No nodules. No JVD.  PULMONARY: decreased breath sounds with mild rhonchi worse at bases bilaterally.   CARDIOVASCULAR: S1 and S2. Regular rate and rhythm. No murmurs, rubs, or gallops. No edema. Pedal pulses 2+ bilaterally.  GASTROINTESTINAL: Soft, nontender, nondistended. No masses. Positive bowel sounds. No hepatosplenomegaly.  MUSCULOSKELETAL: No swelling, clubbing, or edema. Range of motion full in all extremities.  NEUROLOGIC: Cranial nerves II through XII are intact. No gross focal neurological deficits. Sensation intact. Reflexes intact.  SKIN: No ulceration, lesions, rashes, or cyanosis. Skin warm and dry. Turgor intact.  PSYCHIATRIC: Mood, affect within normal limits. The patient is awake, alert and oriented x 3. Insight, judgment intact.       IMAGING   Narrative & Impression  CLINICAL DATA:  Abnormal CT chest, follow-up   EXAM: CT CHEST WITHOUT CONTRAST   TECHNIQUE: Multidetector CT imaging of the chest was performed following the standard protocol without IV contrast.   RADIATION DOSE REDUCTION: This exam was performed according to the departmental dose-optimization program which includes automated exposure control, adjustment of the mA and/or kV according  to patient size and/or use of iterative reconstruction technique.   COMPARISON:  CT chest dated 11/10/2022   FINDINGS: Cardiovascular: The heart is normal in size. No pericardial effusion.   No evidence of thoracic aortic aneurysm. Mild atherosclerotic calcifications of the aortic arch.   Mediastinum/Nodes: No suspicious mediastinal lymphadenopathy.   Visualized thyroid is unremarkable.   Lungs/Pleura: 6.3 x 6.1 cm masslike patchy opacity in the posterior left upper lobe (series 3/image 56), previously 6.6 x 6.7 cm.   Near complete resolution of the additional patchy/nodular opacities in the posterior left upper lobe and right lower lobe, with mild post infectious/inflammatory scarring in the right lower lobe.   Additional 8 mm nodular opacity in the posterior right lower lobe (series 3/image 129),  previously 7 mm. 5 mm subpleural nodular opacity in the posterior right upper lobe (series 3/image 45), previously 6 mm.   Mild subpleural scarring in the left lower lobe (series 3/image 119).   No focal consolidation.   Severe centrilobular and paraseptal emphysematous changes, upper lung predominant.   No pleural effusion or pneumothorax.   Upper Abdomen: Visualized upper abdomen is grossly unremarkable, noting vascular calcifications.   Musculoskeletal: Visualized osseous structures are within normal limits.   IMPRESSION: 6.3 cm masslike patchy opacity in the posterior left upper lobe, mildly improved. However, given persistence, primary bronchogenic carcinoma is not excluded. PET-CT is suggested for further evaluation.   Near complete resolution of the prior posterior left upper lobe and right lower lobe pneumonia, with mild residual post infectious inflammatory scarring.   Stable right lung nodules measuring up to 8 mm, as above. Continued attention on follow-up is suggested.   Aortic Atherosclerosis (ICD10-I70.0) and Emphysema (ICD10-J43.9).     Electronically Signed   By: Charline Bills M.D.   On: 03/20/2023 23:15    ASSESSMENT/PLAN   Left upper lobe lung lesion with necrotic focus    Suspect carcinoma of lung    - location of lesion is at fissure may predispose to pneumothorax   - reviewed with patient and family they wish to proceed as planned    -Reviewed risks/complications and benefits with patient, risks include infection, pneumothorax/pneumomediastinum which may require chest tube placement as well as overnight/prolonged hospitalization and possible mechanical ventilation. Other risks include bleeding and very rarely death.  Patient understands risks and wishes to proceed.  Additional questions were answered, and patient is aware that post procedure patient will be going home with family and may experience cough with possible clots on expectoration as  well as phlegm which may last few days as well as hoarseness of voice post intubation and mechanical ventilation.   -plan for fiberoptic bronchoscopy with therapeutic aspiration of tracheobronchial tree followed by bronchoscopy with BAL of left upper lobe followed by robotic bronchoscopy with transbronchial and endobronchial biopsy followed by endobronchial ultrasound with lymph node biopsies.       Thank you for allowing me to participate in the care of this patient.   Patient/Family are satisfied with care plan and all questions have been answered.    Provider disclosure: Patient with at least one acute or chronic illness or injury that poses a threat to life or bodily function and is being managed actively during this encounter.  All of the below services have been performed independently by signing provider:  review of prior documentation from internal and or external health records.  Review of previous and current lab results.  Interview and comprehensive assessment during patient visit today. Review of current and  previous chest radiographs/CT scans. Discussion of management and test interpretation with health care team and patient/family.   This document was prepared using Dragon voice recognition software and may include unintentional dictation errors.     Vida Rigger, M.D.  Division of Pulmonary & Critical Care Medicine

## 2023-05-18 NOTE — Anesthesia Procedure Notes (Signed)
Procedure Name: Intubation Date/Time: 05/18/2023 12:40 PM  Performed by: Morene Crocker, CRNAPre-anesthesia Checklist: Patient identified, Patient being monitored, Timeout performed, Emergency Drugs available and Suction available Patient Re-evaluated:Patient Re-evaluated prior to induction Oxygen Delivery Method: Circle system utilized Preoxygenation: Pre-oxygenation with 100% oxygen Induction Type: IV induction Ventilation: Mask ventilation without difficulty Laryngoscope Size: 3 and McGrath Grade View: Grade I Tube type: Oral Tube size: 8.5 mm Number of attempts: 1 Airway Equipment and Method: Stylet Placement Confirmation: ETT inserted through vocal cords under direct vision, positive ETCO2 and breath sounds checked- equal and bilateral Secured at: 21 cm Tube secured with: Tape Dental Injury: Teeth and Oropharynx as per pre-operative assessment  Comments: Smooth atraumatic intubation, no complications noted.

## 2023-05-18 NOTE — Anesthesia Preprocedure Evaluation (Signed)
Anesthesia Evaluation  Patient identified by MRN, date of birth, ID band Patient awake    Reviewed: Allergy & Precautions, NPO status , Patient's Chart, lab work & pertinent test results  History of Anesthesia Complications Negative for: history of anesthetic complications  Airway Mallampati: III   Neck ROM: Full    Dental  (+) Upper Dentures, Lower Dentures, Dental Advidsory Given   Pulmonary shortness of breath and with exertion, COPD (2L O2 at night and as needed during day),  oxygen dependent, neg recent URI, former smoker   + rhonchi        Cardiovascular hypertension, (-) angina +CHF  (-) Past MI and (-) Cardiac Stents Normal cardiovascular exam(-) dysrhythmias (-) Valvular Problems/Murmurs Rhythm:Regular Rate:Normal  ECG 07/13/22:  Sinus bradycardia with Premature atrial complexes Otherwise normal ECG   Neuro/Psych negative neurological ROS     GI/Hepatic Neg liver ROS,GERD  ,,  Endo/Other  negative endocrine ROS    Renal/GU negative Renal ROS     Musculoskeletal  (+) Arthritis ,    Abdominal   Peds  Hematology negative hematology ROS (+)   Anesthesia Other Findings Pulmonology note 07/14/22:  Impression/ Plan: Copd, severe- very severe obstruction, on oxygen, . His spiro has decreased.  Continue Trelegy one puff q day. albuterol Neb q 6 hours prn  02 to 3 liters with ambulation Daliresp 250 mg q day and increase to 500 mg /day if nausea is not an issue. ( did not start it as yet) F/u in 5 months  Rhinitis mild cough Flonase/claritin Singulair 10 mg q day ( he was off it, refilled)  Pre op clearance Pulmonary wise he is cleared for the holep procedure vs the TURP    Internal medicine note 04/13/22:  Assessment/Plan:   Benign essential HTN (primary encounter diagnosis) Chronic systolic HF (heart failure) (CMS-HCC) Chronic respiratory failure with hypoxia (CMS-HCC) Hyperlipidemia, mixed GERD  without esophagitis Prostate cancer screening  Assessment and Plan  1. Hypertension. Well-controlled current medications. 2. Chronic CHF. No recent exacerbation. 3. Chronic respiratory failure. He is on 2 L nasal cannula. 4. Hyperlipidemia. Continue statin. 5. GERD. Continue PPI as needed. 6. BPH. Continue current medications.    Reproductive/Obstetrics                             Anesthesia Physical Anesthesia Plan  ASA: 3  Anesthesia Plan: General   Post-op Pain Management:    Induction: Intravenous  PONV Risk Score and Plan: 2 and Treatment may vary due to age or medical condition, Ondansetron and Dexamethasone  Airway Management Planned: Oral ETT  Additional Equipment:   Intra-op Plan:   Post-operative Plan: Extubation in OR  Informed Consent: I have reviewed the patients History and Physical, chart, labs and discussed the procedure including the risks, benefits and alternatives for the proposed anesthesia with the patient or authorized representative who has indicated his/her understanding and acceptance.       Plan Discussed with: CRNA  Anesthesia Plan Comments:         Anesthesia Quick Evaluation

## 2023-05-21 ENCOUNTER — Encounter: Payer: Self-pay | Admitting: Pulmonary Disease

## 2023-05-22 LAB — CYTOLOGY - NON PAP

## 2023-05-22 LAB — SURGICAL PATHOLOGY

## 2023-05-22 NOTE — Anesthesia Postprocedure Evaluation (Signed)
 Anesthesia Post Note  Patient: Bryan Montoya  Procedure(s) Performed: ROBOTIC ASSISTED NAVIGATIONAL BRONCHOSCOPY VIDEO BRONCHOSCOPY WITH ENDOBRONCHIAL ULTRASOUND  Patient location during evaluation: PACU Anesthesia Type: General Level of consciousness: awake and alert Pain management: pain level controlled Vital Signs Assessment: post-procedure vital signs reviewed and stable Respiratory status: spontaneous breathing, nonlabored ventilation, respiratory function stable and patient connected to nasal cannula oxygen Cardiovascular status: blood pressure returned to baseline and stable Postop Assessment: no apparent nausea or vomiting Anesthetic complications: no   No notable events documented.   Last Vitals:  Vitals:   05/18/23 1430 05/18/23 1443  BP: (!) 152/82 (!) 164/81  Pulse: (!) 59 62  Resp: 17 16  Temp: (!) 36.2 C 36.6 C  SpO2: 96% 93%    Last Pain:  Vitals:   05/19/23 1718  TempSrc:   PainSc: 0-No pain                 Lenard Simmer

## 2023-06-20 ENCOUNTER — Encounter: Payer: Self-pay | Admitting: Pulmonary Disease

## 2023-08-01 ENCOUNTER — Other Ambulatory Visit: Payer: Self-pay | Admitting: Pulmonary Disease

## 2023-08-01 DIAGNOSIS — J8410 Pulmonary fibrosis, unspecified: Secondary | ICD-10-CM

## 2023-08-01 DIAGNOSIS — R918 Other nonspecific abnormal finding of lung field: Secondary | ICD-10-CM

## 2023-08-22 ENCOUNTER — Ambulatory Visit
Admission: RE | Admit: 2023-08-22 | Discharge: 2023-08-22 | Disposition: A | Discharge status: Home / Self Care | Source: Ambulatory Visit | Attending: Pulmonary Disease | Admitting: Pulmonary Disease

## 2023-08-22 ENCOUNTER — Ambulatory Visit: Payer: Medicare Other | Admitting: Urology

## 2023-08-22 DIAGNOSIS — R918 Other nonspecific abnormal finding of lung field: Secondary | ICD-10-CM | POA: Insufficient documentation

## 2023-08-22 DIAGNOSIS — J8410 Pulmonary fibrosis, unspecified: Secondary | ICD-10-CM | POA: Diagnosis present

## 2023-08-22 MED ORDER — IOHEXOL 300 MG/ML  SOLN
75.0000 mL | Freq: Once | INTRAMUSCULAR | Status: AC | PRN
  Administered 2023-08-22: 75 mL via INTRAVENOUS

## 2023-09-04 ENCOUNTER — Other Ambulatory Visit: Payer: Self-pay | Admitting: Pulmonary Disease

## 2023-09-06 ENCOUNTER — Encounter
Admission: RE | Admit: 2023-09-06 | Discharge: 2023-09-06 | Disposition: A | Discharge status: Home / Self Care | Source: Ambulatory Visit | Attending: Pulmonary Disease | Admitting: Pulmonary Disease

## 2023-09-06 ENCOUNTER — Other Ambulatory Visit: Payer: Self-pay

## 2023-09-06 DIAGNOSIS — J449 Chronic obstructive pulmonary disease, unspecified: Secondary | ICD-10-CM

## 2023-09-06 DIAGNOSIS — J9601 Acute respiratory failure with hypoxia: Secondary | ICD-10-CM

## 2023-09-06 DIAGNOSIS — R001 Bradycardia, unspecified: Secondary | ICD-10-CM

## 2023-09-06 DIAGNOSIS — J9611 Chronic respiratory failure with hypoxia: Secondary | ICD-10-CM

## 2023-09-06 DIAGNOSIS — I10 Essential (primary) hypertension: Secondary | ICD-10-CM

## 2023-09-06 DIAGNOSIS — I5022 Chronic systolic (congestive) heart failure: Secondary | ICD-10-CM

## 2023-09-06 DIAGNOSIS — R0609 Other forms of dyspnea: Secondary | ICD-10-CM

## 2023-09-06 NOTE — Patient Instructions (Addendum)
 Your procedure is scheduled on: 09/07/23  Report to the Registration Desk on the 1st floor of the [facility]. To find out your arrival time, please call [phone] between 1PM - 3PM on: 09/06/23 If your arrival time is 6:00 am, do not arrive before that time as the [facility] entrance doors do not open until 6:00 am.  REMEMBER: Instructions that are not followed completely may result in serious medical risk, up to and including death; or upon the discretion of your surgeon and anesthesiologist your surgery may need to be rescheduled.  Do not eat food or drink any liquids after midnight the night before surgery.  No gum chewing or hard candies.  One week prior to surgery: Stop Anti-inflammatories (NSAIDS) such as Advil, Aleve, Ibuprofen, Motrin, Naproxen, Naprosyn and Aspirin based products such as Excedrin, Goody's Powder, BC Powder. You may take Tylenol  if needed for pain up until the day of surgery.  Stop ANY OVER THE COUNTER supplements until after surgery.  ON THE DAY OF SURGERY ONLY TAKE THESE MEDICATIONS WITH SIPS OF WATER:  finasteride  (PROSCAR )     No Alcohol for 24 hours before or after surgery.  No Smoking including e-cigarettes for 24 hours before surgery.  No chewable tobacco products for at least 6 hours before surgery.  No nicotine patches on the day of surgery.  Do not use any "recreational" drugs for at least a week (preferably 2 weeks) before your surgery.  Please be advised that the combination of cocaine and anesthesia may have negative outcomes, up to and including death. If you test positive for cocaine, your surgery will be cancelled.  On the morning of surgery brush your teeth with toothpaste and water, you may rinse your mouth with mouthwash if you wish. Do not swallow any toothpaste or mouthwash.  Use CHG Soap or wipes as directed on instruction sheet.  Do not wear jewelry, make-up, hairpins, clips or nail polish.  For welded (permanent)  jewelry: bracelets, anklets, waist bands, etc.  Please have this removed prior to surgery.  If it is not removed, there is a chance that hospital personnel will need to cut it off on the day of surgery.  Do not wear lotions, powders, or perfumes.   Do not shave body hair from the neck down 48 hours before surgery.  Contact lenses, hearing aids and dentures may not be worn into surgery.  Do not bring valuables to the hospital. [facility] is not responsible for any missing/lost belongings or valuables.   Notify your doctor if there is any change in your medical condition (cold, fever, infection).  Wear comfortable clothing (specific to your surgery type) to the hospital.  After surgery, you can help prevent lung complications by doing breathing exercises.  Take deep breaths and cough every 1-2 hours. Your doctor may order a device called an Incentive Spirometer to help you take deep breaths.  When coughing or sneezing, hold a pillow firmly against your incision with both hands. This is called "splinting." Doing this helps protect your incision. It also decreases belly discomfort.  If you are being admitted to the hospital overnight, leave your suitcase in the car. After surgery it may be brought to your room.  In case of increased patient census, it may be necessary for you, the patient, to continue your postoperative care in the Same Day Surgery department.  If you are being discharged the day of surgery, you will not be allowed to drive home. You will need a  responsible individual to drive you home and stay with you for 24 hours after surgery.   If you are taking public transportation, you will need to have a responsible individual with you.  Please call the Pre-admissions Testing Dept. at [phone] if you have any questions about these instructions.  Surgery Visitation Policy:  Patients having surgery or a procedure may have two visitors.  Children under the age of 45 must  have an adult with them who is not the patient.  Inpatient Visitation:    Visiting hours are 7 a.m. to 8 p.m. Up to four visitors are allowed at one time in a patient room. The visitors may rotate out with other people during the day.  One visitor age 69 or older may stay with the patient overnight and must be in the room by 8 p.m.

## 2023-09-07 ENCOUNTER — Ambulatory Visit

## 2023-09-07 ENCOUNTER — Other Ambulatory Visit: Payer: Self-pay

## 2023-09-07 ENCOUNTER — Encounter
Admission: RE | Disposition: A | Discharge status: Home / Self Care | Payer: Self-pay | Source: Home / Self Care | Attending: Pulmonary Disease

## 2023-09-07 ENCOUNTER — Ambulatory Visit
Admission: RE | Admit: 2023-09-07 | Discharge: 2023-09-07 | Disposition: A | Discharge status: Home / Self Care | Attending: Pulmonary Disease | Admitting: Pulmonary Disease

## 2023-09-07 DIAGNOSIS — I5022 Chronic systolic (congestive) heart failure: Secondary | ICD-10-CM | POA: Diagnosis not present

## 2023-09-07 DIAGNOSIS — R0602 Shortness of breath: Secondary | ICD-10-CM | POA: Insufficient documentation

## 2023-09-07 DIAGNOSIS — J9601 Acute respiratory failure with hypoxia: Secondary | ICD-10-CM

## 2023-09-07 DIAGNOSIS — R590 Localized enlarged lymph nodes: Secondary | ICD-10-CM | POA: Insufficient documentation

## 2023-09-07 DIAGNOSIS — I110 Hypertensive heart disease with heart failure: Secondary | ICD-10-CM | POA: Diagnosis not present

## 2023-09-07 DIAGNOSIS — R0609 Other forms of dyspnea: Secondary | ICD-10-CM

## 2023-09-07 DIAGNOSIS — J60 Coalworker's pneumoconiosis: Secondary | ICD-10-CM | POA: Insufficient documentation

## 2023-09-07 DIAGNOSIS — J439 Emphysema, unspecified: Secondary | ICD-10-CM | POA: Insufficient documentation

## 2023-09-07 DIAGNOSIS — K219 Gastro-esophageal reflux disease without esophagitis: Secondary | ICD-10-CM | POA: Insufficient documentation

## 2023-09-07 DIAGNOSIS — J9611 Chronic respiratory failure with hypoxia: Secondary | ICD-10-CM

## 2023-09-07 DIAGNOSIS — J449 Chronic obstructive pulmonary disease, unspecified: Secondary | ICD-10-CM | POA: Insufficient documentation

## 2023-09-07 DIAGNOSIS — J8410 Pulmonary fibrosis, unspecified: Secondary | ICD-10-CM | POA: Diagnosis not present

## 2023-09-07 DIAGNOSIS — R001 Bradycardia, unspecified: Secondary | ICD-10-CM

## 2023-09-07 DIAGNOSIS — I700 Atherosclerosis of aorta: Secondary | ICD-10-CM | POA: Diagnosis not present

## 2023-09-07 DIAGNOSIS — Z9981 Dependence on supplemental oxygen: Secondary | ICD-10-CM | POA: Diagnosis not present

## 2023-09-07 DIAGNOSIS — R918 Other nonspecific abnormal finding of lung field: Secondary | ICD-10-CM | POA: Diagnosis present

## 2023-09-07 DIAGNOSIS — Z87891 Personal history of nicotine dependence: Secondary | ICD-10-CM | POA: Diagnosis not present

## 2023-09-07 DIAGNOSIS — I10 Essential (primary) hypertension: Secondary | ICD-10-CM

## 2023-09-07 HISTORY — PX: VIDEO BRONCHOSCOPY WITH ENDOBRONCHIAL ULTRASOUND: SHX6177

## 2023-09-07 HISTORY — PX: VIDEO BRONCHOSCOPY WITH ENDOBRONCHIAL NAVIGATION: SHX6175

## 2023-09-07 LAB — BASIC METABOLIC PANEL WITH GFR
Anion gap: 6 (ref 5–15)
BUN: 16 mg/dL (ref 8–23)
CO2: 27 mmol/L (ref 22–32)
Calcium: 8.6 mg/dL — ABNORMAL LOW (ref 8.9–10.3)
Chloride: 107 mmol/L (ref 98–111)
Creatinine, Ser: 0.91 mg/dL (ref 0.61–1.24)
GFR, Estimated: >60 mL/min (ref >60)
Glucose, Bld: 85 mg/dL (ref 70–99)
Potassium: 3.6 mmol/L (ref 3.5–5.1)
Sodium: 140 mmol/L (ref 135–145)

## 2023-09-07 LAB — CBC
HCT: 42.4 % (ref 39.0–52.0)
Hemoglobin: 13.9 g/dL (ref 13.0–17.0)
MCH: 30.4 pg (ref 26.0–34.0)
MCHC: 32.8 g/dL (ref 30.0–36.0)
MCV: 92.8 fL (ref 80.0–100.0)
Platelets: 224 10*3/uL (ref 150–400)
RBC: 4.57 MIL/uL (ref 4.22–5.81)
RDW: 14 % (ref 11.5–15.5)
WBC: 5.6 10*3/uL (ref 4.0–10.5)
nRBC: 0 % (ref 0.0–0.2)

## 2023-09-07 SURGERY — BRONCHOSCOPY, WITH EBUS
Anesthesia: General

## 2023-09-07 MED ORDER — DEXAMETHASONE SODIUM PHOSPHATE 10 MG/ML IJ SOLN
INTRAMUSCULAR | Status: DC | PRN
  Administered 2023-09-07: 10 mg via INTRAVENOUS

## 2023-09-07 MED ORDER — LIDOCAINE HCL (PF) 2 % IJ SOLN
INTRAMUSCULAR | Status: AC
  Filled 2023-09-07: qty 5

## 2023-09-07 MED ORDER — FENTANYL CITRATE (PF) 100 MCG/2ML IJ SOLN: 25.0000 ug | INTRAMUSCULAR | Status: DC | PRN

## 2023-09-07 MED ORDER — ONDANSETRON HCL 4 MG/2ML IJ SOLN
INTRAMUSCULAR | Status: DC | PRN
  Administered 2023-09-07: 4 mg via INTRAVENOUS

## 2023-09-07 MED ORDER — LIDOCAINE HCL (CARDIAC) PF 100 MG/5ML IV SOSY
PREFILLED_SYRINGE | INTRAVENOUS | Status: DC | PRN
  Administered 2023-09-07: 100 mg via INTRAVENOUS

## 2023-09-07 MED ORDER — ROCURONIUM BROMIDE 100 MG/10ML IV SOLN
INTRAVENOUS | Status: DC | PRN
  Administered 2023-09-07: 50 mg via INTRAVENOUS

## 2023-09-07 MED ORDER — CEFAZOLIN SODIUM-DEXTROSE 2-4 GM/100ML-% IV SOLN
INTRAVENOUS | Status: AC
  Filled 2023-09-07: qty 100

## 2023-09-07 MED ORDER — ORAL CARE MOUTH RINSE: 15.0000 mL | Freq: Once | OROMUCOSAL | Status: DC

## 2023-09-07 MED ORDER — SEVOFLURANE IN SOLN
RESPIRATORY_TRACT | Status: AC
  Filled 2023-09-07: qty 250

## 2023-09-07 MED ORDER — PROPOFOL 10 MG/ML IV BOLUS
INTRAVENOUS | Status: AC
  Filled 2023-09-07: qty 20

## 2023-09-07 MED ORDER — CHLORHEXIDINE GLUCONATE 0.12 % MT SOLN: 15.0000 mL | Freq: Once | OROMUCOSAL | Status: DC

## 2023-09-07 MED ORDER — SUGAMMADEX SODIUM 200 MG/2ML IV SOLN
INTRAVENOUS | Status: DC | PRN
  Administered 2023-09-07: 130 mg via INTRAVENOUS

## 2023-09-07 MED ORDER — DROPERIDOL 2.5 MG/ML IJ SOLN
0.6250 mg | Freq: Once | INTRAMUSCULAR | Status: DC | PRN
Start: 2023-09-07 — End: 2023-09-07

## 2023-09-07 MED ORDER — CHLORHEXIDINE GLUCONATE 0.12 % MT SOLN
OROMUCOSAL | Status: AC
  Filled 2023-09-07: qty 15

## 2023-09-07 MED ORDER — PROPOFOL 10 MG/ML IV BOLUS
INTRAVENOUS | Status: DC | PRN
Start: 2023-09-07 — End: 2023-09-07
  Administered 2023-09-07: 100 mg via INTRAVENOUS

## 2023-09-07 MED ORDER — LACTATED RINGERS IV SOLN: INTRAVENOUS | Status: DC

## 2023-09-07 MED ORDER — FENTANYL CITRATE (PF) 100 MCG/2ML IJ SOLN
INTRAMUSCULAR | Status: DC | PRN
  Administered 2023-09-07: 25 ug via INTRAVENOUS

## 2023-09-07 MED ORDER — FENTANYL CITRATE (PF) 100 MCG/2ML IJ SOLN
INTRAMUSCULAR | Status: AC
  Filled 2023-09-07: qty 2

## 2023-09-07 NOTE — H&P (Signed)
 PULMONOLOGY         Date: 09/07/2023,   MRN# [id number] Bryan Montoya 1956/01/10     AdmissionWeight: 63.5 kg                 CurrentWeight: 63.5 kg    CHIEF COMPLAINT:   LEFT UPPER AND LOWER LOBE MASS WITH HILAR ADENOPATHY   HISTORY OF PRESENT ILLNESS   This is a pleasant male with hx of emphysema and COPD, CHF, GERD, was seen in pulmonary clinic with left upper and lower lobe lesions noted on CT chest. We discussed these before and had biopsy of these lesions but they were noted to be non cancerous on biopsy. There was findings of inflammation and fibrosis consistent with ulceration on pathology report.  We treated him with 90d of antibiotics and steroids but the lesion only improved partially.  After further discourse we decided to proceed with biopsy again to confirm absence of malignancy due to suspicious appearance of this lesion despite it being smaller in size. Today we plan to perform left lung upper and lower lobe airway inspection with BAL for microbiology, aspiration of any noted mucus plugging of tracheobronchial tree, robotic navigational bronchoscopy for left upper and lower lung biopsy as well as endobronchial US  for lymph node evaluation and biopsy. Patient and I discussed benefits and risks of procedure and he wishes to proceed. Reviewed risks/complications and benefits with patient, risks include infection, pneumothorax/pneumomediastinum which may require chest tube placement as well as overnight/prolonged hospitalization and possible mechanical ventilation. Other risks include bleeding and very rarely death.  Patient understands risks and wishes to proceed.  Additional questions were answered, and patient is aware that post procedure patient will be going home with family and may experience cough with possible clots on expectoration as well as phlegm which may last few days as well as hoarseness of voice post intubation and mechanical ventilation.    PAST MEDICAL  HISTORY   Past Medical History:  Diagnosis Date   Aortic atherosclerosis (HCC)    Arthritis    Benign prostatic hyperplasia with urinary obstruction    Bradycardia    COPD (chronic obstructive pulmonary disease) (HCC)    Dyspnea    Emphysema lung (HCC)    GERD (gastroesophageal reflux disease)    HFrEF (heart failure with reduced ejection fraction) (HCC)    a.) TTE 01/03/2016: EF 45%, glob hk, mild LVH, norm RVSF, mild AR/MR/TR; b.) TTE 05/14/2023: EF 35-40%, no RWMAs, G1DD, norm RVSF, triv MR/TR, mild AR   HLD (hyperlipidemia)    Hypertension    Mass of upper lobe of left lung 03/09/2023   a.) CT chest 03/09/2023: 6.3 cm mass-like patchy opacity posterior LUL; b.) PET CT 04/03/2023: large partial necrotic appearing LUL mass with SUV max 14.26 --> consistent with primary bronchogenic carcinoma   Multilevel degenerative disc disease    a.) s/p partial laminectomy with L4-S1 decompression and far lateral foraminotomy excision of L5-S1 disc herniation (05/2012); b.) s/p bilateral rexploration lumbar decompression and fusion L4-S1 (08/04/2016)   Multiple lung nodules on CT    Nephrolithiasis    Oxygen dependent    2 Liters resting , 2.5 Liters bedtime , 3 Liters with exertion.   Pneumonia due to COVID-19 virus      SURGICAL HISTORY   Past Surgical History:  Procedure Laterality Date   BILATERAL REEXPLORATION LUMBAR DECOMPRESSION STABILIZATION SPINAL FUSION L4-5, L5-S1 Bilateral 08/04/2016   CYSTOSCOPY WITH INSERTION OF UROLIFT N/A 07/17/2022   Procedure:  CYSTOSCOPY WITH INSERTION OF UROLIFT;  Surgeon: Dustin Gimenez, MD;  Location: [facility];  Service: Urology;  Laterality: N/A;   ENDOSCOPIC RELEASE TRANSVERSE CARPAL LIGAMENT OF HAND Right 2014   FOOT SURGERY Right 1991   KNEE ARTHROSCOPY W/ MENISCECTOMY Right 06/05/2005   PARTIAL LAMINECTOMY WITH L4-S1 DECOMPRESSION AND FAR LATERAL FORAMINOTOMY EXCISION OF L5-S1 DISC HERNIATION N/A 05/2012   VIDEO BRONCHOSCOPY WITH ENDOBRONCHIAL  ULTRASOUND N/A 05/18/2023   Procedure: VIDEO BRONCHOSCOPY WITH ENDOBRONCHIAL ULTRASOUND;  Surgeon: Erskin Hearing, MD;  Location: [facility];  Service: Thoracic;  Laterality: N/A;     FAMILY HISTORY   History reviewed. No pertinent family history.   SOCIAL HISTORY   Social History   Tobacco Use   Smoking status: Former    Current packs/day: 0.00    Average packs/day: 1 pack/day for 30.0 years (30.0 ttl pk-yrs)    Types: Cigarettes    Start date: 01/02/1976    Quit date: 01/01/2006    Years since quitting: 17.6   Smokeless tobacco: Never   Tobacco comments:    patient does not smoke  Vaping Use   Vaping status: Never Used  Substance Use Topics   Alcohol use: Never   Drug use: Never     MEDICATIONS    Home Medication:    Current Medication:  Current Facility-Administered Medications:    chlorhexidine  (PERIDEX ) 0.12 % solution 15 mL, 15 mL, Mouth/Throat, Once **OR** Oral care mouth rinse, 15 mL, Mouth Rinse, Once, Lattie Poli, MD   lactated ringers  infusion, , Intravenous, Continuous, Lattie Poli, MD    ALLERGIES   Hydrocodone  and Hydrocodone  bit-homatrop mbr     REVIEW OF SYSTEMS    Review of Systems:  Gen:  Denies  fever, sweats, chills weigh loss  HEENT: Denies blurred vision, double vision, ear pain, eye pain, hearing loss, nose bleeds, sore throat Cardiac:  No dizziness, chest pain or heaviness, chest tightness,edema Resp:   reports dyspnea chronically  Gi: Denies swallowing difficulty, stomach pain, nausea or vomiting, diarrhea, constipation, bowel incontinence Gu:  Denies bladder incontinence, burning urine Ext:   Denies Joint pain, stiffness or swelling Skin: Denies  skin rash, easy bruising or bleeding or hives Endoc:  Denies polyuria, polydipsia , polyphagia or weight change Psych:   Denies depression, insomnia or hallucinations   Other:  All other systems negative   VS: BP (!) 155/86   Pulse (!) 49   Temp 98.2 F (36.8 C) (Temporal)    Resp 17   Ht 5\' 8"  (1.727 m)   Wt 63.5 kg   SpO2 96%   BMI 21.29 kg/m      PHYSICAL EXAM    GENERAL:NAD, no fevers, chills, no weakness no fatigue HEAD: Normocephalic, atraumatic.  EYES: Pupils equal, round, reactive to light. Extraocular muscles intact. No scleral icterus.  MOUTH: Moist mucosal membrane. Dentition intact. No abscess noted.  EAR, NOSE, THROAT: Clear without exudates. No external lesions.  NECK: Supple. No thyromegaly. No nodules. No JVD.  PULMONARY: decreased breath sounds with mild rhonchi worse at bases bilaterally.  CARDIOVASCULAR: S1 and S2. Regular rate and rhythm. No murmurs, rubs, or gallops. No edema. Pedal pulses 2+ bilaterally.  GASTROINTESTINAL: Soft, nontender, nondistended. No masses. Positive bowel sounds. No hepatosplenomegaly.  MUSCULOSKELETAL: No swelling, clubbing, or edema. Range of motion full in all extremities.  NEUROLOGIC: Cranial nerves II through XII are intact. No gross focal neurological deficits. Sensation intact. Reflexes intact.  SKIN: No ulceration, lesions, rashes, or cyanosis. Skin warm and dry. Turgor intact.  PSYCHIATRIC: Mood, affect within normal limits. The patient is awake, alert and oriented x 3. Insight, judgment intact.       IMAGING   Narrative & Impression  CLINICAL DATA:  Follow up lung mass. Biopsy results of a granulomatous inflammation fibrosis   EXAM: CT CHEST WITH CONTRAST   TECHNIQUE: Multidetector CT imaging of the chest was performed during intravenous contrast administration.   RADIATION DOSE REDUCTION: This exam was performed according to the departmental dose-optimization program which includes automated exposure control, adjustment of the mA and/or kV according to patient size and/or use of iterative reconstruction technique.   CONTRAST:  75mL OMNIPAQUE  IOHEXOL  300 MG/ML  SOLN   COMPARISON:  CT 05/15/2023 and older.  PET-CT 04/03/2023.   FINDINGS: Cardiovascular: Heart is nonenlarged. No  pericardial effusion. Thoracic aorta is normal course and caliber with slight atherosclerotic plaque. Some calcification. There is also plaque extending along the great vessels. Enlargement of the pulmonary arteries. Please correlate for pulmonary artery hypertension.   Mediastinum/Nodes: Preserved thyroid gland. Slightly patulous thoracic esophagus. Possible small hiatal hernia. No specific abnormal lymph node enlargement identified in the axillary regions, hilum or mediastinum. There is some nodes with calcifications identified including right paratracheal and right hilar consistent with old granulomatous disease. Some of the larger nodes on the previous examination appears smaller today. Example subcarinal node which previously had a short axis of 11 mm, today on image 78 has short axis of 7 mm. Series 2.   Lungs/Pleura: Advanced emphysematous lung changes are identified. No pneumothorax or effusion. There is some breathing motion.   The nodular area identified on the previous in the left lower lobe which measured 2.3 x 1.6 cm, today is much smaller with some residual opacity along the course of the bronchus on series 4, image 110 measuring 12 x 7 mm. The larger masslike area identified in the posterior left upper lobe abutting the interlobar fissure which previously measured 6.7 x 6.3 cm, today when attempting to measured in a similar fashion would measure 4.1 by 5.7 cm on series 4, image 50. Smaller.   The bandlike and nodular areas in the posterior right lower lobe are also again seen. The nodular component which previously measured 8 mm, today when measured in a similar fashion measures 8 mm on image 123 of series 4, similar. No new dominant nodular areas identified in the lungs.   Upper Abdomen: Adrenal glands are preserved in the upper abdomen. Gallbladder is nondilated. Of note there is atypical distribution of bowel with loops of transverse colon extending between the  left hepatic lobe in the anterior abdominal wall.   Musculoskeletal: Mild degenerative changes along the spine.   IMPRESSION: Masslike opacities identified in the left lung are smaller today compared to the previous CT scan of February 2025. This includes the larger more dominant focus left upper lobe in the smaller old left lower lobe focus.   The nodular and linear changes in the right lower lobe are similar to previous examination.   Recommend continued follow up surveillance.   Slight decrease in small prominent mediastinal lymph nodes.   Advanced emphysematous changes. Enlargement of the pulmonary artery. Please correlate with pulmonary artery hypertension.   Aortic Atherosclerosis (ICD10-I70.0) and Emphysema (ICD10-J43.9).     Electronically Signed   By: Adrianna Horde M.D.   On: 08/22/2023 12:25    ASSESSMENT/PLAN   Left upper and lower lobe mass with hilar adenopathy Today we plan to perform left lung upper and lower lobe airway inspection  with BAL for microbiology, aspiration of any noted mucus plugging of tracheobronchial tree, robotic navigational bronchoscopy for left upper and lower lung biopsy as well as endobronchial US  for lymph node evaluation and biopsy. Patient and I discussed benefits and risks of procedure and he wishes to proceed. Reviewed risks/complications and benefits with patient, risks include infection, pneumothorax/pneumomediastinum which may require chest tube placement as well as overnight/prolonged hospitalization and possible mechanical ventilation. Other risks include bleeding and very rarely death.  Patient understands risks and wishes to proceed.  Additional questions were answered, and patient is aware that post procedure patient will be going home with family and may experience cough with possible clots on expectoration as well as phlegm which may last few days as well as hoarseness of voice post intubation and mechanical ventilation.             Thank you for allowing me to participate in the care of this patient.   Patient/Family are satisfied with care plan and all questions have been answered.    Provider disclosure: Patient with at least one acute or chronic illness or injury that poses a threat to life or bodily function and is being managed actively during this encounter.  All of the below services have been performed independently by signing provider:  review of prior documentation from internal and or external health records.  Review of previous and current lab results.  Interview and comprehensive assessment during patient visit today. Review of current and previous chest radiographs/CT scans. Discussion of management and test interpretation with health care team and patient/family.   This document was prepared using Dragon voice recognition software and may include unintentional dictation errors.     Adilynn Bessey, M.D.  Division of Pulmonary & Critical Care Medicine

## 2023-09-07 NOTE — Anesthesia Preprocedure Evaluation (Signed)
 Anesthesia Evaluation  Patient identified by MRN, date of birth, ID band Patient awake    Reviewed: Allergy & Precautions, NPO status , Patient's Chart, lab work & pertinent test results  History of Anesthesia Complications Negative for: history of anesthetic complications  Airway Mallampati: III   Neck ROM: Full    Dental  (+) Upper Dentures, Lower Dentures, Dental Advidsory Given   Pulmonary shortness of breath and with exertion, COPD (2L O2 at night and as needed during day),  oxygen dependent, neg recent URI, former smoker   + rhonchi        Cardiovascular hypertension, (-) angina +CHF  (-) Past MI and (-) Cardiac Stents Normal cardiovascular exam(-) dysrhythmias (-) Valvular Problems/Murmurs Rhythm:Regular Rate:Normal  ECG 07/13/22:  Sinus bradycardia with Premature atrial complexes Otherwise normal ECG   Neuro/Psych negative neurological ROS     GI/Hepatic Neg liver ROS,GERD  ,,  Endo/Other  negative endocrine ROS    Renal/GU negative Renal ROS     Musculoskeletal  (+) Arthritis ,    Abdominal   Peds  Hematology negative hematology ROS (+)   Anesthesia Other Findings Pulmonology note 07/14/22:  Impression/ Plan: Copd, severe- very severe obstruction, on oxygen, . His spiro has decreased.  Continue Trelegy one puff q day. albuterol Neb q 6 hours prn  02 to 3 liters with ambulation Daliresp 250 mg q day and increase to 500 mg /day if nausea is not an issue. ( did not start it as yet) F/u in 5 months  Rhinitis mild cough Flonase/claritin Singulair 10 mg q day ( he was off it, refilled)  Pre op clearance Pulmonary wise he is cleared for the holep procedure vs the TURP    Internal medicine note 04/13/22:  Assessment/Plan:   Benign essential HTN (primary encounter diagnosis) Chronic systolic HF (heart failure) (CMS-HCC) Chronic respiratory failure with hypoxia (CMS-HCC) Hyperlipidemia, mixed GERD  without esophagitis Prostate cancer screening  Assessment and Plan  1. Hypertension. Well-controlled current medications. 2. Chronic CHF. No recent exacerbation. 3. Chronic respiratory failure. He is on 2 L nasal cannula. 4. Hyperlipidemia. Continue statin. 5. GERD. Continue PPI as needed. 6. BPH. Continue current medications.    Reproductive/Obstetrics                             Anesthesia Physical Anesthesia Plan  ASA: 3  Anesthesia Plan: General   Post-op Pain Management:    Induction: Intravenous  PONV Risk Score and Plan: 2 and Treatment may vary due to age or medical condition, Ondansetron and Dexamethasone  Airway Management Planned: Oral ETT  Additional Equipment:   Intra-op Plan:   Post-operative Plan: Extubation in OR  Informed Consent: I have reviewed the patients History and Physical, chart, labs and discussed the procedure including the risks, benefits and alternatives for the proposed anesthesia with the patient or authorized representative who has indicated his/her understanding and acceptance.       Plan Discussed with: CRNA  Anesthesia Plan Comments:         Anesthesia Quick Evaluation

## 2023-09-07 NOTE — Transfer of Care (Signed)
 Immediate Anesthesia Transfer of Care Note  Patient: Bryan Montoya  Procedure(s) Performed: BRONCHOSCOPY, WITH EBUS VIDEO BRONCHOSCOPY WITH ENDOBRONCHIAL NAVIGATION  Patient Location: PACU  Anesthesia Type:General  Level of Consciousness: awake, alert , oriented, and patient cooperative  Airway & Oxygen Therapy: Patient Spontanous Breathing and Patient connected to face mask oxygen  Post-op Assessment: Report given to RN, Post -op Vital signs reviewed and stable, and Patient moving all extremities X 4  Post vital signs: Reviewed and stable  Last Vitals:  Vitals Value Taken Time  BP 154/81 09/07/23 1206  Temp 36.1 C 09/07/23 1206  Pulse 53 09/07/23 1206  Resp 12 09/07/23 1206  SpO2 97 % 09/07/23 1206  Vitals shown include unfiled device data.  Last Pain:  Vitals:   09/07/23 1206  TempSrc:   PainSc: 0-No pain       Patent airway to PACU, breathing spontaneously on 6L O2 via FM. Pt awake, responsive and comfortable. VSS.    Complications: No notable events documented.

## 2023-09-07 NOTE — Procedures (Signed)
 ROBOTIC NAVIGATIONAL BRONCHOSCOPY PROCEDURE NOTE  FIBEROPTIC BRONCHOSCOPY WITH THERAPEUTIC ASPIRATION OF TRACHEOBRONCHIAL TREE BRONCHOALVEOLAR LAVAGE PROCEDURE NOTE  TRANSBRONCHIAL LUNG BIOPSY  ENDOBRONCHIAL ULTRASOUND >/=1 LYMPH NODE  PROCEDURE NOTE    Flexible bronchoscopy was performed  by : Jaclynn Mast MD  assistance by : 1)Repiratory therapist  and 2)[employer] staff and 3) Anesthesia team and 4) Flouroscopy team and 5) [employer] supporting staff   Indication for the procedure was :  Pre-procedural H&P. The following assessment was performed on the day of the procedure prior to initiating sedation History:  Chest pain n Dyspnea y Hemoptysis n Cough y Fever n Other pertinent items n  Examination Vital signs -reviewed as per nursing documentation today Cardiac    Murmurs: n  Rubs : n  Gallop: n Lungs Wheezing: n Rales : n Rhonchi :y  Other pertinent findings: SOB/hypoxemia due to chronic lung disease   Pre-procedural assessment for Procedural Sedation included: Depth of sedation: As per anesthesia team  ASA Classification:  2 Mallampati airway assessment: 3    Medication list reviewed: y  The patient's interval history was taken and revealed: no new complaints The pre- procedure physical examination revealed: No new findings Refer to prior clinic note for details.  Informed Consent: Informed consent was obtained from:  patient after explanation of procedure and risks, benefits, as well as alternative procedures available.  Explanation of level of sedation and possible transfusion was also provided.    Procedural Preparation: Time out was performed and patient was identified by name and birthdate and procedure to be performed and side for sampling, if any, was specified. Pt was intubated by anesthesia.  The patient was appropriately draped.   Fiberoptic bronchoscopy with airway inspection, therapeutic aspiration of tracheobronchial tree and BAL Procedure  findings:  Bronchoscope was inserted via ETT  without difficulty.  Posterior oropharynx, epiglottis, arytenoids, false cords and vocal cords were not visualized as these were bypassed by endotracheal tube. The distal trachea was normal in circumference and appearance without mucosal, cartilaginous or branching abnormalities.  The main carina was mildly splayed . Mucus plugging was noted bilaterally, anthracotic pigmentation noted bilaterally, friable mucosa noted at LLL The mucosa was : friable at LLL  Airways were notable for:        exophytic lesions :n       extrinsic compression in the following distributions: n.       Friable mucosa: y       Teacher, music /pigmentation: n   BAL was done at left lower lobe for microbiology. Mucus plugging was treated with therapeutic aspiration bilaterally   Post procedure Diagnosis:   Mucus plugging of tracheobronchial tree     Robotic Navigational Bronchoscopy Procedure Findings:   Post appropriate planning and registration peripheral navigation was used to visualize target lesion.    Target 1 - Superior left lower lobe lesion - grossly abnormal via vision probe. Tarnsbronchial FNA x 7 and transbronchial surgical pathology x 4      Media Information  Document Information  Visual confirmation   Media Information  Document Information Tool in lesion confirmation    Post procedure diagnosis: in process for cytology      Endobronchial ultrasound assisted hilar and mediastinal lymph node biopsies procedure findings: The fiberoptic bronchoscope was removed and the EBUS scope was introduced. Examination began to evaluate for pathologically enlarged lymph nodes starting on the Right  side progressing to the Left side.  All lymph node biopsies performed with 22g COOK needle. Lymph node biopsies were sent  in cytolite for all stations.  Station 10 R - 6mm not biopsied Station 7 - 1.4cm - biopsied 3 times Station 10L - 5mm not  biopsied   Post procedure diagnosis:  Mediastinal lymphadenopathy   Specimens obtained included: Broncho-alveolar lavage site:LLL   sent for microbiology including AFB, gram stain and culture, Fungal cultures                              20ml volume infused 10ml volume returned with clear cellular appearance   Immediate sampling complications included:None  Epinephrine zero ml was used topically  The bronchoscopy was terminated due to completion of the planned procedure and the bronchoscope was removed.   Total dosage of Lidocaine  was 3mg  Total fluoroscopy time was as per radiology  minutes   Estimated Blood loss: <5 expected cc.  Complications included:  None immediate   Preliminary CXR findings :  In process   Disposition: home with family  Follow up with Dr. Landri Dorsainvil in 5 days for result discussion.     Erskin Hearing MD  [facility] Division of Pulmonary & Critical Care Medicine

## 2023-09-07 NOTE — Anesthesia Procedure Notes (Signed)
 Procedure Name: Intubation Date/Time: 09/07/2023 10:33 AM  Performed by: Sherrlyn Dolores, CRNAPre-anesthesia Checklist: Patient identified, Patient being monitored, Timeout performed, Emergency Drugs available and Suction available Patient Re-evaluated:Patient Re-evaluated prior to induction Oxygen Delivery Method: Circle system utilized Preoxygenation: Pre-oxygenation with 100% oxygen Induction Type: IV induction Ventilation: Mask ventilation without difficulty Laryngoscope Size: 4 and McGrath Grade View: Grade I Tube type: Oral Tube size: 8.5 mm Number of attempts: 1 Airway Equipment and Method: Stylet Placement Confirmation: ETT inserted through vocal cords under direct vision, positive ETCO2 and breath sounds checked- equal and bilateral Secured at: 21 cm Tube secured with: Tape Dental Injury: Teeth and Oropharynx as per pre-operative assessment  Comments: DL x1 with McGrath MAC 4 blade, grade 1 view. Atraumatic intubation. Dentition unchanged from preop baseline.

## 2023-09-09 LAB — CULTURE, BAL-QUANTITATIVE W GRAM STAIN: Culture: NO GROWTH

## 2023-09-09 LAB — ACID FAST SMEAR (AFB, MYCOBACTERIA): Acid Fast Smear: NEGATIVE

## 2023-09-10 ENCOUNTER — Encounter: Payer: Self-pay | Admitting: Pulmonary Disease

## 2023-09-10 LAB — SURGICAL PATHOLOGY

## 2023-09-10 LAB — CYTOLOGY - NON PAP

## 2023-09-12 LAB — ASPERGILLUS ANTIGEN, BAL/SERUM: Aspergillus Ag, BAL/Serum: 0.34 {index} (ref 0.00–0.49)

## 2023-09-18 NOTE — Anesthesia Postprocedure Evaluation (Signed)
 Anesthesia Post Note  Patient: Juventino Oppenheim  Procedure(s) Performed: BRONCHOSCOPY, WITH EBUS VIDEO BRONCHOSCOPY WITH ENDOBRONCHIAL NAVIGATION  Patient location during evaluation: PACU Anesthesia Type: General Level of consciousness: awake and alert Pain management: pain level controlled Vital Signs Assessment: post-procedure vital signs reviewed and stable Respiratory status: spontaneous breathing, nonlabored ventilation, respiratory function stable and patient connected to nasal cannula oxygen Cardiovascular status: blood pressure returned to baseline and stable Postop Assessment: no apparent nausea or vomiting Anesthetic complications: no   No notable events documented.   Last Vitals:  Vitals:   09/07/23 1230 09/07/23 1245  BP: 137/73 (!) 160/79  Pulse: (!) 50 60  Resp: 18 18  Temp: (!) 36.2 C (!) 36.1 C  SpO2: 98% 91%    Last Pain:  Vitals:   09/07/23 1245  TempSrc: Temporal  PainSc: 0-No pain                 Vanice Genre

## 2023-09-25 ENCOUNTER — Encounter: Payer: Self-pay | Admitting: Pulmonary Disease

## 2023-09-28 LAB — CULTURE, FUNGUS WITHOUT SMEAR

## 2023-10-21 LAB — ACID FAST CULTURE WITH REFLEXED SENSITIVITIES (MYCOBACTERIA): Acid Fast Culture: NEGATIVE
